# Patient Record
Sex: Male | Born: 1940 | Race: White | Hispanic: No | Marital: Married | Smoking: Never smoker
Health system: Southern US, Community
[De-identification: ages and names within clinical notes are randomized; demographics above are authoritative.]

## PROBLEM LIST (undated history)

## (undated) DIAGNOSIS — M1990 Unspecified osteoarthritis, unspecified site: Secondary | ICD-10-CM

## (undated) DIAGNOSIS — J209 Acute bronchitis, unspecified: Secondary | ICD-10-CM

## (undated) DIAGNOSIS — N281 Cyst of kidney, acquired: Secondary | ICD-10-CM

## (undated) DIAGNOSIS — E669 Obesity, unspecified: Secondary | ICD-10-CM

## (undated) DIAGNOSIS — I10 Essential (primary) hypertension: Secondary | ICD-10-CM

## (undated) DIAGNOSIS — K7689 Other specified diseases of liver: Secondary | ICD-10-CM

## (undated) HISTORY — PX: WISDOM TOOTH EXTRACTION: SHX21

## (undated) HISTORY — PX: CYST EXCISION: SHX5701

## (undated) HISTORY — DX: Obesity, unspecified: E66.9

---

## 2012-09-05 DIAGNOSIS — Q446 Cystic disease of liver: Secondary | ICD-10-CM | POA: Insufficient documentation

## 2014-01-10 ENCOUNTER — Encounter: Payer: Self-pay | Admitting: Sports Medicine

## 2014-01-10 ENCOUNTER — Ambulatory Visit (INDEPENDENT_AMBULATORY_CARE_PROVIDER_SITE_OTHER): Payer: Medicare Other | Admitting: Sports Medicine

## 2014-01-10 VITALS — BP 167/90 | HR 83 | Ht 69.0 in | Wt 198.0 lb

## 2014-01-10 DIAGNOSIS — Z96652 Presence of left artificial knee joint: Secondary | ICD-10-CM | POA: Insufficient documentation

## 2014-01-10 DIAGNOSIS — M1710 Unilateral primary osteoarthritis, unspecified knee: Secondary | ICD-10-CM

## 2014-01-10 DIAGNOSIS — M1712 Unilateral primary osteoarthritis, left knee: Secondary | ICD-10-CM

## 2014-01-10 MED ORDER — DEVILS CLAW ROOT POWD: Status: DC

## 2014-01-10 MED ORDER — GLUCOSAMINE-CHONDROITIN 750-600 MG PO CHEW: 2.0000 | CHEWABLE_TABLET | Freq: Two times a day (BID) | ORAL | Status: DC

## 2014-01-10 MED ORDER — DICLOFENAC SODIUM 2 % TD SOLN: 2.0000 | Freq: Two times a day (BID) | TRANSDERMAL | Status: DC

## 2014-01-10 MED ORDER — CELECOXIB 200 MG PO CAPS: ORAL_CAPSULE | ORAL | Status: DC

## 2014-01-10 NOTE — Progress Notes (Signed)
Patient ID: Ralph Kim, male   DOB: July 13, 1940, 73 y.o.   MRN: [id number]   Subjective:    I'm seeing this patient as a consultation for: Dr. Archie Balboa  CC: Left knee pain  HPI: Ralph Kim is a very pleasant 73 year old male with self-reported history of osteoarthritis of the left knee who presents with 2 years of constant left knee pain. This initially began when he stepped from a ladder and twisted his knee. Pain is worse with use and improves with rest. Per patient, there is bone-on-bone osteoarthritis of the medial compartment. He has previously received cortisone shots, but could not continue due to allergic reaction. Also had some relief with oral Voltaren, but discontinued after diagnosis of bleeding liver cyst. Subsequently switched to topical Voltaren which provided very little relief and he has since stopped this medication. Completed one course of Supartz with last injection about 2 months ago, but pain has since returned. TENS unit unsuccessful. Has what sounds to be an osteoarthritis unloader brace, but reports infrequent use.  Past medical history, Surgical history, Family history not pertinant except as noted below, Social history, Allergies, and medications have been entered into the medical record, reviewed, and no changes needed.   Review of Systems: No headache, visual changes, nausea, vomiting, diarrhea, constipation, dizziness, abdominal pain, skin rash, fevers, chills, night sweats, weight loss, swollen lymph nodes, body aches, joint swelling, muscle aches, chest pain, shortness of breath, mood changes, visual or auditory hallucinations.   Objective:   General: Well Developed, well nourished, and in no acute distress.  Neuro/Psych: Alert and oriented x3, extra-ocular muscles intact, able to move all 4 extremities, sensation grossly intact. Skin: Warm and dry, no rashes noted.  Respiratory: Not using accessory muscles, speaking in full sentences, trachea midline.  Cardiovascular:  Pulses palpable, no extremity edema. Abdomen: Does not appear distended.  Left Knee: Normal to inspection with no erythema, effusion or obvious bony abnormalities. Palpation normal with no warmth, patellar tenderness, or condyle tenderness. Positive medial joint line tenderness. ROM full in flexion and extension and lower leg rotation. Ligaments with solid consistent endpoints including ACL, PCL, LCL, MCL. Positive Mcmurray's. Negative Apley's and Thessalonian tests. Non painful patellar compression. Patellar glide with painless crepitus. Patellar and quadriceps tendons unremarkable. Hamstring and quadriceps strength is normal.   Impression and Recommendations:   This case required medical decision making of moderate complexity.  Osteoarthritis of left knee: This patient has a reported history of bone-on-bone osteoarthritis in the medial compartment that has failed steroid injection and Visco supplementation. We did discuss exhausting conservative measures, but that knee replacement is a likely endpoint. - Pennsaid - Celebrex - Supplementation with glucosamine and chondroitin, devil's claw - Increase use of OA unloader brace - Referral to formal PT - Return for custom orthotics

## 2014-01-10 NOTE — Assessment & Plan Note (Signed)
Per patient has bone-on-bone osteoarthritis in the medial compartment. Has already failed steroid injection and Visco supplementation. He does have what sounds to be an OA unloader brace, and he will wear this more often. Switching to Celebrex, adding topical NSAID. Also adding glucosamine and chondroitin as well as devils claw. Formal physical therapy. Return to see me for custom orthotics. Unfortunately I do think he has a knee replacement in the future.

## 2014-01-16 ENCOUNTER — Ambulatory Visit (INDEPENDENT_AMBULATORY_CARE_PROVIDER_SITE_OTHER): Payer: Medicare Other | Admitting: Physical Therapy

## 2014-01-16 ENCOUNTER — Encounter: Payer: Medicare Other | Admitting: Sports Medicine

## 2014-01-16 DIAGNOSIS — M6281 Muscle weakness (generalized): Secondary | ICD-10-CM

## 2014-01-16 DIAGNOSIS — R262 Difficulty in walking, not elsewhere classified: Secondary | ICD-10-CM

## 2014-01-16 DIAGNOSIS — M25569 Pain in unspecified knee: Secondary | ICD-10-CM

## 2014-01-16 DIAGNOSIS — M1710 Unilateral primary osteoarthritis, unspecified knee: Secondary | ICD-10-CM

## 2014-01-17 ENCOUNTER — Ambulatory Visit: Payer: Medicare Other | Admitting: Physical Therapy

## 2014-01-24 ENCOUNTER — Encounter: Payer: Self-pay | Admitting: Sports Medicine

## 2014-01-24 ENCOUNTER — Ambulatory Visit (INDEPENDENT_AMBULATORY_CARE_PROVIDER_SITE_OTHER): Payer: Medicare Other | Admitting: Sports Medicine

## 2014-01-24 VITALS — BP 155/79 | HR 75 | Ht 69.0 in | Wt 197.0 lb

## 2014-01-24 DIAGNOSIS — M1710 Unilateral primary osteoarthritis, unspecified knee: Secondary | ICD-10-CM

## 2014-01-24 DIAGNOSIS — M1712 Unilateral primary osteoarthritis, left knee: Secondary | ICD-10-CM

## 2014-01-24 NOTE — Progress Notes (Signed)

## 2014-01-24 NOTE — Assessment & Plan Note (Signed)
Custom orthotics as above. Continues to use Glucosamine and Chondroitin, topical diclofenac, physical therapy. Has discontinued Celebrex, had a rash, does not desire to proceed with any further oral NSAIDs. Continues to be resistant to total knee arthroplasty.

## 2014-01-31 ENCOUNTER — Encounter: Payer: Medicare Other | Admitting: Physical Therapy

## 2014-02-07 ENCOUNTER — Encounter (INDEPENDENT_AMBULATORY_CARE_PROVIDER_SITE_OTHER): Payer: Medicare Other

## 2014-02-07 DIAGNOSIS — R262 Difficulty in walking, not elsewhere classified: Secondary | ICD-10-CM

## 2014-02-07 DIAGNOSIS — M6281 Muscle weakness (generalized): Secondary | ICD-10-CM

## 2014-02-07 DIAGNOSIS — M25569 Pain in unspecified knee: Secondary | ICD-10-CM

## 2014-02-07 DIAGNOSIS — M1710 Unilateral primary osteoarthritis, unspecified knee: Secondary | ICD-10-CM

## 2014-10-30 ENCOUNTER — Other Ambulatory Visit: Payer: Self-pay | Admitting: Sports Medicine

## 2014-10-30 DIAGNOSIS — M1712 Unilateral primary osteoarthritis, left knee: Secondary | ICD-10-CM

## 2014-10-30 MED ORDER — DICLOFENAC SODIUM 2 % TD SOLN: 2.0000 | Freq: Two times a day (BID) | TRANSDERMAL | Status: DC

## 2015-01-30 ENCOUNTER — Telehealth: Payer: Self-pay | Admitting: Sports Medicine

## 2015-01-30 NOTE — Telephone Encounter (Signed)
No problem, I'll do it. 15 min pre-op clearance visit, not a physical

## 2015-01-30 NOTE — Telephone Encounter (Signed)
Patient's wife called and req to have Dr. Karie Schwalbe do a medical clearance for knee replacement surgery. Patient stated his pcp is Dr. Archie Balboa but they are unhappy their and feel like a number. Thanks

## 2015-02-03 ENCOUNTER — Ambulatory Visit (INDEPENDENT_AMBULATORY_CARE_PROVIDER_SITE_OTHER): Payer: Medicare Other | Admitting: Sports Medicine

## 2015-02-03 ENCOUNTER — Encounter: Payer: Self-pay | Admitting: Sports Medicine

## 2015-02-03 VITALS — BP 169/81 | HR 71 | Wt 197.0 lb

## 2015-02-03 DIAGNOSIS — M1712 Unilateral primary osteoarthritis, left knee: Secondary | ICD-10-CM | POA: Diagnosis not present

## 2015-02-03 DIAGNOSIS — Z01818 Encounter for other preprocedural examination: Secondary | ICD-10-CM | POA: Diagnosis not present

## 2015-02-03 LAB — COMPREHENSIVE METABOLIC PANEL
AST: 16 U/L (ref 10–35)
BUN: 14 mg/dL (ref 7–25)
CO2: 25 mmol/L (ref 20–31)
Creat: 0.83 mg/dL (ref 0.70–1.18)
Glucose, Bld: 94 mg/dL (ref 65–99)
Sodium: 140 mmol/L (ref 135–146)
Total Bilirubin: 0.5 mg/dL (ref 0.2–1.2)
Total Protein: 6.5 g/dL (ref 6.1–8.1)

## 2015-02-03 LAB — CBC
HCT: 42.8 % (ref 39.0–52.0)
Hemoglobin: 14.9 g/dL (ref 13.0–17.0)
MCH: 31.6 pg (ref 26.0–34.0)
MCHC: 34.8 g/dL (ref 30.0–36.0)
MCV: 90.9 fL (ref 78.0–100.0)
MPV: 10.6 fL (ref 8.6–12.4)
Platelets: 210 10*3/uL (ref 150–400)
RBC: 4.71 MIL/uL (ref 4.22–5.81)
RDW: 13.9 % (ref 11.5–15.5)
WBC: 4.7 10*3/uL (ref 4.0–10.5)

## 2015-02-03 LAB — COMPREHENSIVE METABOLIC PANEL WITH GFR
ALT: 14 U/L (ref 9–46)
Albumin: 4.3 g/dL (ref 3.6–5.1)
Alkaline Phosphatase: 67 U/L (ref 40–115)
Calcium: 9.1 mg/dL (ref 8.6–10.3)
Chloride: 105 mmol/L (ref 98–110)
Potassium: 4.2 mmol/L (ref 3.5–5.3)

## 2015-02-03 MED ORDER — CARVEDILOL 6.25 MG PO TABS: 6.2500 mg | ORAL_TABLET | Freq: Two times a day (BID) | ORAL | Status: DC

## 2015-02-03 NOTE — Progress Notes (Signed)
  Subjective:    CC: Preoperative clearance  HPI:  This is a pleasant 74 year old male, for sometime now we have been treating him for his in stage left knee osteoarthritis, he has finally agreed to proceed with total knee arthroplasty and needs surgical clearance. He does have greater than 4 metabolic equivalents of exercise tolerance without chest pain or shortness of breath.  Past medical history, Surgical history, Family history not pertinant except as noted below, Social history, Allergies, and medications have been entered into the medical record, reviewed, and no changes needed.   Review of Systems: No fevers, chills, night sweats, weight loss, chest pain, or shortness of breath.   Objective:    General: Well Developed, well nourished, and in no acute distress.  Neuro: Alert and oriented x3, extra-ocular muscles intact, sensation grossly intact.  HEENT: Normocephalic, atraumatic, pupils equal round reactive to light, neck supple, no masses, no lymphadenopathy, thyroid nonpalpable.  Skin: Warm and dry, no rashes. Cardiac: Regular rate and rhythm, no murmurs rubs or gallops, no lower extremity edema.  Respiratory: Clear to auscultation bilaterally. Not using accessory muscles, speaking in full sentences.  Twelve-lead EKG personally reviewed and shows normal axis, normal rate, normal rhythm. No Q waves, no ST changes.  Impression and Recommendations:   I spent 40 minutes with this patient, greater than 50% was face-to-face time counseling regarding the above diagnoses

## 2015-02-03 NOTE — Assessment & Plan Note (Signed)
Patient has greater than 4 metabolic equivalents of exercise tolerance, ECG is normal, checking CBC, renal function. Blood pressure is somewhat elevated so I am going to add a low-dose carvedilol, we will recheck his blood pressure in 2 weeks. Perioperative beta blockers do reduce cardiovascular mortality.

## 2015-02-03 NOTE — Assessment & Plan Note (Signed)
Patient is planning to have a knee arthroplasty, needs preoperative medical clearance.

## 2015-02-04 LAB — PROTIME-INR
INR: 1.1 (ref <1.50)
Prothrombin Time: 14.3 seconds (ref 11.6–15.2)

## 2015-02-04 LAB — APTT: aPTT: 30 seconds (ref 24–37)

## 2015-02-05 NOTE — Addendum Note (Signed)
Addended by: Collie Siad on: 02/05/2015 11:33 AM   Modules accepted: Orders

## 2015-02-17 ENCOUNTER — Encounter: Payer: Self-pay | Admitting: Sports Medicine

## 2015-02-17 ENCOUNTER — Ambulatory Visit (INDEPENDENT_AMBULATORY_CARE_PROVIDER_SITE_OTHER): Payer: Medicare Other | Admitting: Sports Medicine

## 2015-02-17 VITALS — BP 176/79 | HR 63 | Ht 69.0 in | Wt 200.0 lb

## 2015-02-17 DIAGNOSIS — Z01818 Encounter for other preprocedural examination: Secondary | ICD-10-CM

## 2015-02-17 MED ORDER — CARVEDILOL 12.5 MG PO TABS: 12.5000 mg | ORAL_TABLET | Freq: Two times a day (BID) | ORAL | Status: DC

## 2015-02-17 NOTE — Progress Notes (Signed)
  Subjective:    CC: follow-up  HPI: This pleasant 74 year old male with hypertension returns, we did not clear him at the last visit due to uncontrolled hypertension, I started carvedilol low-dose the last visit and he returns today with blood pressure significantly improved. His home records indicate essentially normal blood pressures however he is very elevated today suggesting a component of whitecoat hypertension.  Past medical history, Surgical history, Family history not pertinant except as noted below, Social history, Allergies, and medications have been entered into the medical record, reviewed, and no changes needed.   Review of Systems: No fevers, chills, night sweats, weight loss, chest pain, or shortness of breath.   Objective:    General: Well Developed, well nourished, and in no acute distress.  Neuro: Alert and oriented x3, extra-ocular muscles intact, sensation grossly intact.  HEENT: Normocephalic, atraumatic, pupils equal round reactive to light, neck supple, no masses, no lymphadenopathy, thyroid nonpalpable.  Skin: Warm and dry, no rashes. Cardiac: Regular rate and rhythm, no murmurs rubs or gallops, no lower extremity edema.  Respiratory: Clear to auscultation bilaterally. Not using accessory muscles, speaking in full sentences.  Impression and Recommendations:    I spent 25 minutes with this patient, greater than 50% was face-to-face time counseling regarding the above diagnoses

## 2015-02-17 NOTE — Assessment & Plan Note (Signed)
I started carvedilol low-dose the last visit and he returns today with blood pressure significantly improved. His home records indicate essentially normal blood pressures however he is very elevated today suggesting a component of whitecoat hypertension. I'm going to increase to 12.5 mg of carvedilol twice a day, he will return in one week for a blood pressure nurse visit

## 2015-02-24 ENCOUNTER — Ambulatory Visit (INDEPENDENT_AMBULATORY_CARE_PROVIDER_SITE_OTHER): Payer: Medicare Other | Admitting: Sports Medicine

## 2015-02-24 VITALS — BP 168/89 | HR 57

## 2015-02-24 DIAGNOSIS — I10 Essential (primary) hypertension: Secondary | ICD-10-CM | POA: Diagnosis not present

## 2015-02-24 DIAGNOSIS — Z01818 Encounter for other preprocedural examination: Secondary | ICD-10-CM | POA: Diagnosis not present

## 2015-02-24 NOTE — Progress Notes (Signed)
   Subjective:    Patient ID: Ralph Kim, male    DOB: [birth year], 74 y.o.   MRN: [id number]  HPI  Ralph Kim is here for a blood pressure check. He reports taking the carvedilol daily as directed. Home blood pressure readings.   02/20/2015   144/84 02/21/2015   148/77 02/22/2015   134/80                    148/81                    137/82 02/23/2015 130/76 02/24/2015 122/72  Review of Systems     Objective:   Physical Exam        Assessment & Plan:  Hypertension - Blood pressure elevated.

## 2015-02-24 NOTE — Progress Notes (Signed)
Left message for patient to call back and give information on whom needs clearance.

## 2015-02-24 NOTE — Assessment & Plan Note (Signed)
Blood pressures at home remain well-controlled, he is elevated every time he comes here, suggestive of whitecoat hypertension. He will continue his beta blocker perioperatively, and I think he is cleared for surgery at this time, intermediate risk noncardiac.  A letter will be written for clearance.

## 2015-02-25 ENCOUNTER — Telehealth: Payer: Self-pay | Admitting: Sports Medicine

## 2015-02-25 NOTE — Telephone Encounter (Signed)
Left voicemail for Pt advising there is a letter by Dr. Benjamin Stainhekkekandam ready for the Pt. If we need to send this letter somewhere we will need a fax number. Otherwise the letter will be placed upfront for pick up.

## 2015-03-04 NOTE — Progress Notes (Signed)
Sent to Dr Luiz BlareGraves with [facility].

## 2015-03-12 ENCOUNTER — Other Ambulatory Visit: Payer: Self-pay | Admitting: Orthopedic Surgery

## 2015-03-25 ENCOUNTER — Encounter (HOSPITAL_COMMUNITY)
Admission: RE | Admit: 2015-03-25 | Discharge: 2015-03-25 | Disposition: A | Discharge status: Home / Self Care | Payer: Medicare Other | Source: Ambulatory Visit | Attending: Orthopedic Surgery | Admitting: Orthopedic Surgery

## 2015-03-25 ENCOUNTER — Ambulatory Visit (HOSPITAL_COMMUNITY)
Admission: RE | Admit: 2015-03-25 | Discharge: 2015-03-25 | Disposition: A | Discharge status: Home / Self Care | Payer: Medicare Other | Source: Ambulatory Visit | Attending: Orthopedic Surgery | Admitting: Orthopedic Surgery

## 2015-03-25 ENCOUNTER — Encounter (HOSPITAL_COMMUNITY): Payer: Self-pay

## 2015-03-25 DIAGNOSIS — M1712 Unilateral primary osteoarthritis, left knee: Secondary | ICD-10-CM | POA: Diagnosis not present

## 2015-03-25 DIAGNOSIS — Z01818 Encounter for other preprocedural examination: Secondary | ICD-10-CM | POA: Diagnosis present

## 2015-03-25 DIAGNOSIS — Z01812 Encounter for preprocedural laboratory examination: Secondary | ICD-10-CM | POA: Diagnosis not present

## 2015-03-25 HISTORY — DX: Essential (primary) hypertension: I10

## 2015-03-25 HISTORY — DX: Other specified diseases of liver: K76.89

## 2015-03-25 HISTORY — DX: Acute bronchitis, unspecified: J20.9

## 2015-03-25 HISTORY — DX: Unspecified osteoarthritis, unspecified site: M19.90

## 2015-03-25 HISTORY — DX: Cyst of kidney, acquired: N28.1

## 2015-03-25 LAB — SURGICAL PCR SCREEN
MRSA, PCR: NEGATIVE
Staphylococcus aureus: NEGATIVE

## 2015-03-25 LAB — URINALYSIS, ROUTINE W REFLEX MICROSCOPIC
BILIRUBIN URINE: NEGATIVE
Glucose, UA: NEGATIVE mg/dL
HGB URINE DIPSTICK: NEGATIVE
Ketones, ur: NEGATIVE mg/dL
Leukocytes, UA: NEGATIVE
Nitrite: NEGATIVE
PH: 6.5 (ref 5.0–8.0)
Protein, ur: NEGATIVE mg/dL
SPECIFIC GRAVITY, URINE: 1.013 (ref 1.005–1.030)
UROBILINOGEN UA: 1 mg/dL (ref 0.0–1.0)

## 2015-03-25 LAB — CBC WITH DIFFERENTIAL/PLATELET
BASOS ABS: 0 10*3/uL (ref 0.0–0.1)
Basophils Relative: 0 %
EOS ABS: 0.2 10*3/uL (ref 0.0–0.7)
Eosinophils Relative: 3 %
HCT: 44.2 % (ref 39.0–52.0)
Hemoglobin: 14.9 g/dL (ref 13.0–17.0)
LYMPHS ABS: 1.1 10*3/uL (ref 0.7–4.0)
Lymphocytes Relative: 20 %
MCH: 31 pg (ref 26.0–34.0)
MCHC: 33.7 g/dL (ref 30.0–36.0)
MCV: 91.9 fL (ref 78.0–100.0)
MONOS PCT: 8 %
Monocytes Absolute: 0.4 10*3/uL (ref 0.1–1.0)
NEUTROS PCT: 69 %
Neutro Abs: 4 10*3/uL (ref 1.7–7.7)
PLATELETS: 220 10*3/uL (ref 150–400)
RBC: 4.81 MIL/uL (ref 4.22–5.81)
RDW: 12.8 % (ref 11.5–15.5)
WBC: 5.7 10*3/uL (ref 4.0–10.5)

## 2015-03-25 LAB — COMPREHENSIVE METABOLIC PANEL
ALT: 20 U/L (ref 17–63)
ANION GAP: 8 (ref 5–15)
AST: 23 U/L (ref 15–41)
Albumin: 3.9 g/dL (ref 3.5–5.0)
Alkaline Phosphatase: 66 U/L (ref 38–126)
BILIRUBIN TOTAL: 0.4 mg/dL (ref 0.3–1.2)
BUN: 11 mg/dL (ref 6–20)
CHLORIDE: 108 mmol/L (ref 101–111)
CO2: 22 mmol/L (ref 22–32)
Calcium: 9.5 mg/dL (ref 8.9–10.3)
Creatinine, Ser: 0.82 mg/dL (ref 0.61–1.24)
Glucose, Bld: 104 mg/dL — ABNORMAL HIGH (ref 65–99)
POTASSIUM: 4.6 mmol/L (ref 3.5–5.1)
Sodium: 138 mmol/L (ref 135–145)
TOTAL PROTEIN: 6.6 g/dL (ref 6.5–8.1)

## 2015-03-25 LAB — TYPE AND SCREEN
ABO/RH(D): A POS
ANTIBODY SCREEN: NEGATIVE

## 2015-03-25 LAB — APTT: aPTT: 27 seconds (ref 24–37)

## 2015-03-25 LAB — ABO/RH: ABO/RH(D): A POS

## 2015-03-25 LAB — PROTIME-INR
INR: 1.09 (ref 0.00–1.49)
PROTHROMBIN TIME: 14.3 s (ref 11.6–15.2)

## 2015-03-25 NOTE — Pre-Procedure Instructions (Signed)
Ralph Kim  03/25/2015      [facility] - [place], [place] - [address] [place] [address] Phone: [phone] Fax: [phone]    Your procedure is scheduled on Monday, March 30, 2015  Report to [facility] Admitting at 10:45 A.M.  Call this number if you have problems the morning of surgery:  [phone]   Remember:  Do not eat food or drink liquids after midnight Sunday, March 29, 2015  Take these medicines the morning of surgery with A SIP OF WATER :carvedilol (COREG)  Stop taking Aspirin, vitamins, fish oil and herbal medications such as Turmeric. Do not take any NSAIDs ie: Ibuprofen, Advil, Naproxen ( Aleve) or any medication containing Aspirin such as Diclofenac Sodium (PENNSAID) ; stop now  Do not wear jewelry, make-up or nail polish.  Do not wear lotions, powders, or perfumes.  You may not wear deodorant.  Do not shave 48 hours prior to surgery.  Men may shave face and neck.  Do not bring valuables to the hospital.  [facility] is not responsible for any belongings or valuables.  Contacts, dentures or bridgework may not be worn into surgery.  Leave your suitcase in the car.  After surgery it may be brought to your room.  For patients admitted to the hospital, discharge time will be determined by your treatment team.  Patients discharged the day of surgery will not be allowed to drive home.   Name and phone number of your driver:   Special instructions:  Special Instructions:Special Instructions: [facility] - Preparing for Surgery  Before surgery, you can play an important role.  Because skin is not sterile, your skin needs to be as free of germs as possible.  You can reduce the number of germs on you skin by washing with CHG (chlorahexidine gluconate) soap before surgery.  CHG is an antiseptic cleaner which kills germs and bonds with the skin to continue killing germs even after  washing.  Please DO NOT use if you have an allergy to CHG or antibacterial soaps.  If your skin becomes reddened/irritated stop using the CHG and inform your nurse when you arrive at Short Stay.  Do not shave (including legs and underarms) for at least 48 hours prior to the first CHG shower.  You may shave your face.  Please follow these instructions carefully:   1.  Shower with CHG Soap the night before surgery and the morning of Surgery.  2.  If you choose to wash your hair, wash your hair first as usual with your normal shampoo.  3.  After you shampoo, rinse your hair and body thoroughly to remove the Shampoo.  4.  Use CHG as you would any other liquid soap.  You can apply chg directly  to the skin and wash gently with scrungie or a clean washcloth.  5.  Apply the CHG Soap to your body ONLY FROM THE NECK DOWN.  Do not use on open wounds or open sores.  Avoid contact with your eyes, ears, mouth and genitals (private parts).  Wash genitals (private parts) with your normal soap.  6.  Wash thoroughly, paying special attention to the area where your surgery will be performed.  7.  Thoroughly rinse your body with warm water from the neck down.  8.  DO NOT shower/wash with your normal soap after using and rinsing off the CHG Soap.  9.  Pat yourself dry  with a clean towel.            10.  Wear clean pajamas.            11.  Place clean sheets on your bed the night of your first shower and do not sleep with pets.  Day of Surgery  Do not apply any lotions/deodorants the morning of surgery.  Please wear clean clothes to the hospital/surgery center.  Please read over the following fact sheets that you were given. Pain Booklet, Coughing and Deep Breathing, Blood Transfusion Information, Total Joint Packet, MRSA Information and Surgical Site Infection Prevention

## 2015-03-25 NOTE — Progress Notes (Signed)
Pt denies SOB, chest pain, and being under the care of a cardiologist. Pt denies having a stress test, echo and cardiac cath. Pt denies having a chest x ray within the last year.  

## 2015-03-30 ENCOUNTER — Encounter (HOSPITAL_COMMUNITY)
Admission: RE | Disposition: A | Discharge status: Home Health | Payer: Self-pay | Source: Ambulatory Visit | Attending: Orthopedic Surgery

## 2015-03-30 ENCOUNTER — Inpatient Hospital Stay (HOSPITAL_COMMUNITY): Payer: Medicare Other | Admitting: Anesthesiology

## 2015-03-30 ENCOUNTER — Encounter (HOSPITAL_COMMUNITY): Payer: Self-pay | Admitting: *Deleted

## 2015-03-30 ENCOUNTER — Inpatient Hospital Stay (HOSPITAL_COMMUNITY)
Admission: RE | Admit: 2015-03-30 | Discharge: 2015-04-01 | DRG: 470 | Disposition: A | Discharge status: Home Health | Payer: Medicare Other | Source: Ambulatory Visit | Attending: Orthopedic Surgery | Admitting: Orthopedic Surgery

## 2015-03-30 DIAGNOSIS — M25562 Pain in left knee: Secondary | ICD-10-CM | POA: Diagnosis present

## 2015-03-30 DIAGNOSIS — M1712 Unilateral primary osteoarthritis, left knee: Principal | ICD-10-CM | POA: Diagnosis present

## 2015-03-30 DIAGNOSIS — I10 Essential (primary) hypertension: Secondary | ICD-10-CM | POA: Diagnosis present

## 2015-03-30 DIAGNOSIS — Z79899 Other long term (current) drug therapy: Secondary | ICD-10-CM

## 2015-03-30 DIAGNOSIS — Z888 Allergy status to other drugs, medicaments and biological substances status: Secondary | ICD-10-CM | POA: Diagnosis not present

## 2015-03-30 HISTORY — PX: TOTAL KNEE ARTHROPLASTY: SHX125

## 2015-03-30 SURGERY — ARTHROPLASTY, KNEE, TOTAL
Anesthesia: General | Site: Knee | Laterality: Left

## 2015-03-30 MED ORDER — 0.9 % SODIUM CHLORIDE (POUR BTL) OPTIME
TOPICAL | Status: DC | PRN
  Administered 2015-03-30: 1000 mL

## 2015-03-30 MED ORDER — SODIUM CHLORIDE 0.9 % IR SOLN
Status: DC | PRN
  Administered 2015-03-30: 3000 mL

## 2015-03-30 MED ORDER — BACLOFEN 10 MG PO TABS: 10.0000 mg | ORAL_TABLET | Freq: Three times a day (TID) | ORAL | Status: DC | PRN

## 2015-03-30 MED ORDER — ACETAMINOPHEN 325 MG PO TABS: 325.0000 mg | ORAL_TABLET | ORAL | Status: DC | PRN

## 2015-03-30 MED ORDER — HYDROMORPHONE HCL 1 MG/ML IJ SOLN
INTRAMUSCULAR | Status: AC
  Filled 2015-03-30: qty 2

## 2015-03-30 MED ORDER — PROPOFOL 500 MG/50ML IV EMUL
INTRAVENOUS | Status: DC | PRN
  Administered 2015-03-30: 25 ug/kg/min via INTRAVENOUS

## 2015-03-30 MED ORDER — LACTATED RINGERS IV SOLN
INTRAVENOUS | Status: DC
  Administered 2015-03-30 (×3): via INTRAVENOUS

## 2015-03-30 MED ORDER — OXYCODONE HCL 5 MG PO TABS
5.0000 mg | ORAL_TABLET | Freq: Once | ORAL | Status: AC | PRN
  Administered 2015-03-30: 5 mg via ORAL

## 2015-03-30 MED ORDER — SODIUM CHLORIDE 0.9 % IV SOLN
1000.0000 mg | INTRAVENOUS | Status: AC
  Administered 2015-03-30: 1000 mg via INTRAVENOUS
  Filled 2015-03-30: qty 10

## 2015-03-30 MED ORDER — OXYCODONE HCL 5 MG PO TABS
ORAL_TABLET | ORAL | Status: AC
  Filled 2015-03-30: qty 2

## 2015-03-30 MED ORDER — CARVEDILOL 12.5 MG PO TABS
12.5000 mg | ORAL_TABLET | Freq: Two times a day (BID) | ORAL | Status: DC
  Administered 2015-03-31 – 2015-04-01 (×3): 12.5 mg via ORAL
  Filled 2015-03-30 (×3): qty 1

## 2015-03-30 MED ORDER — MIDAZOLAM HCL 2 MG/2ML IJ SOLN
INTRAMUSCULAR | Status: AC
  Filled 2015-03-30: qty 4

## 2015-03-30 MED ORDER — CEFAZOLIN SODIUM-DEXTROSE 2-3 GM-% IV SOLR
2.0000 g | INTRAVENOUS | Status: AC
  Administered 2015-03-30: 2 g via INTRAVENOUS
  Filled 2015-03-30: qty 50

## 2015-03-30 MED ORDER — OXYCODONE-ACETAMINOPHEN 5-325 MG PO TABS: 1.0000 | ORAL_TABLET | ORAL | Status: DC | PRN

## 2015-03-30 MED ORDER — CEFAZOLIN SODIUM-DEXTROSE 2-3 GM-% IV SOLR
2.0000 g | Freq: Four times a day (QID) | INTRAVENOUS | Status: AC
  Administered 2015-03-30 – 2015-03-31 (×2): 2 g via INTRAVENOUS
  Filled 2015-03-30 (×2): qty 50

## 2015-03-30 MED ORDER — HYDROMORPHONE HCL 1 MG/ML IJ SOLN
0.2500 mg | INTRAMUSCULAR | Status: DC | PRN
  Administered 2015-03-30 (×4): 0.5 mg via INTRAVENOUS

## 2015-03-30 MED ORDER — MIDAZOLAM HCL 5 MG/5ML IJ SOLN
INTRAMUSCULAR | Status: DC | PRN
  Administered 2015-03-30 (×2): 1 mg via INTRAVENOUS

## 2015-03-30 MED ORDER — BUPIVACAINE IN DEXTROSE 0.75-8.25 % IT SOLN
INTRATHECAL | Status: DC | PRN
  Administered 2015-03-30: 1.8 mL via INTRATHECAL

## 2015-03-30 MED ORDER — BISACODYL 5 MG PO TBEC: 5.0000 mg | DELAYED_RELEASE_TABLET | Freq: Every day | ORAL | Status: DC | PRN

## 2015-03-30 MED ORDER — MAGNESIUM CITRATE PO SOLN: 1.0000 | Freq: Once | ORAL | Status: DC | PRN

## 2015-03-30 MED ORDER — ACETAMINOPHEN 10 MG/ML IV SOLN
INTRAVENOUS | Status: AC
  Filled 2015-03-30: qty 100

## 2015-03-30 MED ORDER — OXYCODONE HCL 5 MG/5ML PO SOLN: 5.0000 mg | Freq: Once | ORAL | Status: AC | PRN

## 2015-03-30 MED ORDER — ASPIRIN EC 325 MG PO TBEC: 325.0000 mg | DELAYED_RELEASE_TABLET | Freq: Two times a day (BID) | ORAL | Status: DC

## 2015-03-30 MED ORDER — ACETAMINOPHEN 10 MG/ML IV SOLN
1000.0000 mg | Freq: Once | INTRAVENOUS | Status: AC
  Administered 2015-03-30: 1000 mg via INTRAVENOUS

## 2015-03-30 MED ORDER — BUPIVACAINE HCL (PF) 0.5 % IJ SOLN
INTRAMUSCULAR | Status: DC | PRN
  Administered 2015-03-30: 20 mL

## 2015-03-30 MED ORDER — ACETAMINOPHEN 650 MG RE SUPP: 650.0000 mg | Freq: Four times a day (QID) | RECTAL | Status: DC | PRN

## 2015-03-30 MED ORDER — BUPIVACAINE LIPOSOME 1.3 % IJ SUSP
20.0000 mL | Freq: Once | INTRAMUSCULAR | Status: DC
  Filled 2015-03-30: qty 20

## 2015-03-30 MED ORDER — DOCUSATE SODIUM 100 MG PO CAPS
100.0000 mg | ORAL_CAPSULE | Freq: Two times a day (BID) | ORAL | Status: DC
  Administered 2015-03-30 – 2015-04-01 (×4): 100 mg via ORAL
  Filled 2015-03-30 (×4): qty 1

## 2015-03-30 MED ORDER — DIPHENHYDRAMINE HCL 12.5 MG/5ML PO ELIX: 12.5000 mg | ORAL_SOLUTION | ORAL | Status: DC | PRN

## 2015-03-30 MED ORDER — HYDROMORPHONE HCL 1 MG/ML IJ SOLN: 0.2500 mg | INTRAMUSCULAR | Status: DC | PRN

## 2015-03-30 MED ORDER — DEXTROSE 5 % IV SOLN
500.0000 mg | Freq: Four times a day (QID) | INTRAVENOUS | Status: DC | PRN
  Administered 2015-03-30: 500 mg via INTRAVENOUS
  Filled 2015-03-30 (×3): qty 5

## 2015-03-30 MED ORDER — FENTANYL CITRATE (PF) 100 MCG/2ML IJ SOLN
INTRAMUSCULAR | Status: DC | PRN
  Administered 2015-03-30 (×3): 25 ug via INTRAVENOUS
  Administered 2015-03-30: 50 ug via INTRAVENOUS
  Administered 2015-03-30: 25 ug via INTRAVENOUS
  Administered 2015-03-30 (×2): 50 ug via INTRAVENOUS

## 2015-03-30 MED ORDER — ONDANSETRON HCL 4 MG/2ML IJ SOLN
4.0000 mg | Freq: Four times a day (QID) | INTRAMUSCULAR | Status: DC | PRN
  Administered 2015-03-30: 4 mg via INTRAVENOUS
  Filled 2015-03-30: qty 2

## 2015-03-30 MED ORDER — FENTANYL CITRATE (PF) 250 MCG/5ML IJ SOLN
INTRAMUSCULAR | Status: AC
  Filled 2015-03-30: qty 5

## 2015-03-30 MED ORDER — BUPIVACAINE-EPINEPHRINE (PF) 0.5% -1:200000 IJ SOLN
INTRAMUSCULAR | Status: AC
  Filled 2015-03-30: qty 30

## 2015-03-30 MED ORDER — ACETAMINOPHEN 160 MG/5ML PO SOLN
325.0000 mg | ORAL | Status: DC | PRN
  Filled 2015-03-30: qty 20.3

## 2015-03-30 MED ORDER — HYDROMORPHONE HCL 1 MG/ML IJ SOLN
INTRAMUSCULAR | Status: AC
  Filled 2015-03-30: qty 1

## 2015-03-30 MED ORDER — ASPIRIN EC 325 MG PO TBEC
325.0000 mg | DELAYED_RELEASE_TABLET | Freq: Two times a day (BID) | ORAL | Status: DC
  Administered 2015-03-31 – 2015-04-01 (×3): 325 mg via ORAL
  Filled 2015-03-30 (×3): qty 1

## 2015-03-30 MED ORDER — POLYETHYLENE GLYCOL 3350 17 G PO PACK
17.0000 g | PACK | Freq: Every day | ORAL | Status: DC | PRN
Start: 2015-03-30 — End: 2015-04-01

## 2015-03-30 MED ORDER — METHOCARBAMOL 500 MG PO TABS
500.0000 mg | ORAL_TABLET | Freq: Four times a day (QID) | ORAL | Status: DC | PRN
  Administered 2015-03-30 – 2015-04-01 (×4): 500 mg via ORAL
  Filled 2015-03-30 (×5): qty 1

## 2015-03-30 MED ORDER — EPHEDRINE SULFATE 50 MG/ML IJ SOLN
INTRAMUSCULAR | Status: DC | PRN
  Administered 2015-03-30 (×3): 5 mg via INTRAVENOUS

## 2015-03-30 MED ORDER — CHLORHEXIDINE GLUCONATE 4 % EX LIQD: 60.0000 mL | Freq: Once | CUTANEOUS | Status: DC

## 2015-03-30 MED ORDER — OXYCODONE-ACETAMINOPHEN 5-325 MG PO TABS
1.0000 | ORAL_TABLET | ORAL | Status: DC | PRN
  Administered 2015-03-30 – 2015-03-31 (×3): 2 via ORAL
  Administered 2015-03-31: 1 via ORAL
  Administered 2015-03-31 – 2015-04-01 (×2): 2 via ORAL
  Filled 2015-03-30 (×6): qty 2

## 2015-03-30 MED ORDER — ACETAMINOPHEN 325 MG PO TABS: 650.0000 mg | ORAL_TABLET | Freq: Four times a day (QID) | ORAL | Status: DC | PRN

## 2015-03-30 MED ORDER — SODIUM CHLORIDE 0.9 % IV SOLN
INTRAVENOUS | Status: DC
  Administered 2015-03-30: 18:00:00 via INTRAVENOUS

## 2015-03-30 MED ORDER — HYDROMORPHONE HCL 1 MG/ML IJ SOLN
0.5000 mg | INTRAMUSCULAR | Status: AC | PRN
  Administered 2015-03-30 (×2): 0.5 mg via INTRAVENOUS

## 2015-03-30 MED ORDER — HYDROMORPHONE HCL 1 MG/ML IJ SOLN
1.0000 mg | INTRAMUSCULAR | Status: DC | PRN
  Administered 2015-03-30: 1 mg via INTRAVENOUS
  Administered 2015-03-31: 2 mg via INTRAVENOUS
  Filled 2015-03-30: qty 1
  Filled 2015-03-30: qty 2

## 2015-03-30 MED ORDER — ONDANSETRON HCL 4 MG PO TABS: 4.0000 mg | ORAL_TABLET | Freq: Four times a day (QID) | ORAL | Status: DC | PRN

## 2015-03-30 MED ORDER — HYDROMORPHONE HCL 1 MG/ML IJ SOLN
0.5000 mg | INTRAMUSCULAR | Status: DC | PRN
  Administered 2015-03-30 (×2): 0.5 mg via INTRAVENOUS

## 2015-03-30 MED ORDER — PROMETHAZINE HCL 25 MG/ML IJ SOLN
6.2500 mg | Freq: Four times a day (QID) | INTRAMUSCULAR | Status: DC | PRN
Start: 2015-03-30 — End: 2015-04-01

## 2015-03-30 MED ORDER — BUPIVACAINE LIPOSOME 1.3 % IJ SUSP
INTRAMUSCULAR | Status: DC | PRN
  Administered 2015-03-30: 20 mL

## 2015-03-30 MED ORDER — PHENYLEPHRINE HCL 10 MG/ML IJ SOLN
INTRAMUSCULAR | Status: DC | PRN
  Administered 2015-03-30 (×2): 40 ug via INTRAVENOUS

## 2015-03-30 MED ORDER — ALUM & MAG HYDROXIDE-SIMETH 200-200-20 MG/5ML PO SUSP
30.0000 mL | ORAL | Status: DC | PRN
Start: 2015-03-30 — End: 2015-04-01

## 2015-03-30 MED ORDER — ZOLPIDEM TARTRATE 5 MG PO TABS: 5.0000 mg | ORAL_TABLET | Freq: Every evening | ORAL | Status: DC | PRN

## 2015-03-30 SURGICAL SUPPLY — 64 items
BANDAGE ESMARK 6X9 LF (GAUZE/BANDAGES/DRESSINGS) ×1 IMPLANT
BENZOIN TINCTURE PRP APPL 2/3 (GAUZE/BANDAGES/DRESSINGS) ×2 IMPLANT
BLADE SAGITTAL 25.0X1.19X90 (BLADE) ×2 IMPLANT
BLADE SAW SAG 90X13X1.27 (BLADE) ×2 IMPLANT
BNDG ESMARK 6X9 LF (GAUZE/BANDAGES/DRESSINGS) ×2
BOWL SMART MIX CTS (DISPOSABLE) ×2 IMPLANT
CAP KNEE TOTAL 3 SIGMA ×2 IMPLANT
CEMENT HV SMART SET (Cement) ×4 IMPLANT
CLSR STERI-STRIP ANTIMIC 1/2X4 (GAUZE/BANDAGES/DRESSINGS) ×2 IMPLANT
COVER SURGICAL LIGHT HANDLE (MISCELLANEOUS) ×2 IMPLANT
CUFF TOURNIQUET SINGLE 34IN LL (TOURNIQUET CUFF) ×2 IMPLANT
CUFF TOURNIQUET SINGLE 44IN (TOURNIQUET CUFF) IMPLANT
DRAPE EXTREMITY T 121X128X90 (DRAPE) ×2 IMPLANT
DRAPE IMP U-DRAPE 54X76 (DRAPES) ×2 IMPLANT
DRAPE U-SHAPE 47X51 STRL (DRAPES) ×2 IMPLANT
DRSG AQUACEL AG ADV 3.5X10 (GAUZE/BANDAGES/DRESSINGS) ×2 IMPLANT
DRSG MEPILEX BORDER 4X12 (GAUZE/BANDAGES/DRESSINGS) ×2 IMPLANT
DRSG PAD ABDOMINAL 8X10 ST (GAUZE/BANDAGES/DRESSINGS) ×2 IMPLANT
DURAPREP 26ML APPLICATOR (WOUND CARE) ×2 IMPLANT
ELECT REM PT RETURN 9FT ADLT (ELECTROSURGICAL) ×2
ELECTRODE REM PT RTRN 9FT ADLT (ELECTROSURGICAL) ×1 IMPLANT
EVACUATOR 1/8 PVC DRAIN (DRAIN) ×4 IMPLANT
FACESHIELD WRAPAROUND (MASK) ×2 IMPLANT
GAUZE SPONGE 4X4 12PLY STRL (GAUZE/BANDAGES/DRESSINGS) ×2 IMPLANT
GLOVE BIO SURGEON STRL SZ7 (GLOVE) ×2 IMPLANT
GLOVE BIOGEL PI IND STRL 7.0 (GLOVE) ×1 IMPLANT
GLOVE BIOGEL PI IND STRL 8 (GLOVE) ×2 IMPLANT
GLOVE BIOGEL PI INDICATOR 7.0 (GLOVE) ×1
GLOVE BIOGEL PI INDICATOR 8 (GLOVE) ×2
GLOVE ECLIPSE 7.5 STRL STRAW (GLOVE) ×4 IMPLANT
GOWN STRL REUS W/ TWL LRG LVL3 (GOWN DISPOSABLE) ×1 IMPLANT
GOWN STRL REUS W/ TWL XL LVL3 (GOWN DISPOSABLE) ×2 IMPLANT
GOWN STRL REUS W/TWL LRG LVL3 (GOWN DISPOSABLE) ×1
GOWN STRL REUS W/TWL XL LVL3 (GOWN DISPOSABLE) ×2
HANDPIECE INTERPULSE COAX TIP (DISPOSABLE) ×1
HOOD PEEL AWAY FACE SHEILD DIS (HOOD) ×6 IMPLANT
IMMOBILIZER KNEE 20 (SOFTGOODS) ×4 IMPLANT
IMMOBILIZER KNEE 20 THIGH 36 (SOFTGOODS) ×2 IMPLANT
IMMOBILIZER KNEE 22 UNIV (SOFTGOODS) ×2 IMPLANT
KIT BASIN OR (CUSTOM PROCEDURE TRAY) ×2 IMPLANT
KIT ROOM TURNOVER OR (KITS) ×2 IMPLANT
MANIFOLD NEPTUNE II (INSTRUMENTS) ×2 IMPLANT
NEEDLE SPNL 22GX3.5 QUINCKE BK (NEEDLE) ×2 IMPLANT
NS IRRIG 1000ML POUR BTL (IV SOLUTION) ×2 IMPLANT
PACK TOTAL JOINT (CUSTOM PROCEDURE TRAY) ×2 IMPLANT
PACK UNIVERSAL I (CUSTOM PROCEDURE TRAY) ×2 IMPLANT
PAD ARMBOARD 7.5X6 YLW CONV (MISCELLANEOUS) ×4 IMPLANT
PAD CAST 4YDX4 CTTN HI CHSV (CAST SUPPLIES) ×1 IMPLANT
PADDING CAST COTTON 4X4 STRL (CAST SUPPLIES) ×1
SET HNDPC FAN SPRY TIP SCT (DISPOSABLE) ×1 IMPLANT
SPONGE GAUZE 4X4 12PLY STER LF (GAUZE/BANDAGES/DRESSINGS) ×2 IMPLANT
STAPLER VISISTAT 35W (STAPLE) IMPLANT
STRIP CLOSURE SKIN 1/2X4 (GAUZE/BANDAGES/DRESSINGS) ×4 IMPLANT
SUCTION FRAZIER TIP 10 FR DISP (SUCTIONS) ×2 IMPLANT
SUT MNCRL AB 3-0 PS2 18 (SUTURE) IMPLANT
SUT VIC AB 0 CTB1 27 (SUTURE) ×4 IMPLANT
SUT VIC AB 1 CT1 27 (SUTURE) ×2
SUT VIC AB 1 CT1 27XBRD ANBCTR (SUTURE) ×2 IMPLANT
SUT VIC AB 2-0 CTB1 (SUTURE) ×4 IMPLANT
SYR 50ML LL SCALE MARK (SYRINGE) ×2 IMPLANT
TOWEL OR 17X24 6PK STRL BLUE (TOWEL DISPOSABLE) ×2 IMPLANT
TOWEL OR 17X26 10 PK STRL BLUE (TOWEL DISPOSABLE) ×2 IMPLANT
TRAY FOLEY CATH 16FRSI W/METER (SET/KITS/TRAYS/PACK) IMPLANT
WRAP KNEE MAXI GEL POST OP (GAUZE/BANDAGES/DRESSINGS) ×2 IMPLANT

## 2015-03-30 NOTE — Brief Op Note (Signed)
03/30/2015  3:11 PM  PATIENT:  Ralph Kim  74 y.o. male  PRE-OPERATIVE DIAGNOSIS:  osteoarthritis left knee  POST-OPERATIVE DIAGNOSIS:  osteoarthritis left knee  PROCEDURE:  Procedure(s): TOTAL KNEE ARTHROPLASTY (Left)  SURGEON:  Surgeon(s) and Role:    * Jodi GeraldsJohn Laquinton Bihm, MD - Primary  PHYSICIAN ASSISTANT:   ASSISTANTS: Orma Flaming*Bethune  ANESTHESIA:   general  EBL:  Total I/O In: 1800 [I.V.:1800] Out: 25 [Urine:25]  BLOOD ADMINISTERED:none  DRAINS: (1med) Hemovact drain(s) in the l knee with  Suction Open   LOCAL MEDICATIONS USED:  OTHER experel   SPECIMEN:  No Specimen  DISPOSITION OF SPECIMEN:  N/A  COUNTS:  YES  TOURNIQUET:   Total Tourniquet Time Documented: Thigh (Left) - 61 minutes Total: Thigh (Left) - 61 minutes   DICTATION: .Other Dictation: Dictation Number [id number]  PLAN OF CARE: Admit to inpatient   PATIENT DISPOSITION:  PACU - hemodynamically stable.   Delay start of Pharmacological VTE agent (>24hrs) due to surgical blood loss or risk of bleeding: no

## 2015-03-30 NOTE — Progress Notes (Signed)
Orthopedic Tech Progress Note Patient Details:  Ralph Kim 1941/02/02 [id number]  CPM Left Knee CPM Left Knee: On Left Knee Flexion (Degrees): 60 Left Knee Extension (Degrees): 0 Additional Comments: foot roll   Saul FordyceJennifer C Tandra Rosado 03/30/2015, 4:18 PM

## 2015-03-30 NOTE — Transfer of Care (Signed)
Immediate Anesthesia Transfer of Care Note  Patient: Ralph Kim  Procedure(s) Performed: Procedure(s): TOTAL KNEE ARTHROPLASTY (Left)  Patient Location: PACU  Anesthesia Type:General and Spinal  Level of Consciousness: awake, alert , oriented and patient cooperative  Airway & Oxygen Therapy: Patient Spontanous Breathing and Patient connected to nasal cannula oxygen  Post-op Assessment: Report given to RN, Post -op Vital signs reviewed and stable and Patient moving all extremities  Post vital signs: Reviewed and stable  Last Vitals:  Filed Vitals:   03/30/15 1142  BP: 209/85  Pulse:   Temp:   Resp:     Complications: No apparent anesthesia complications

## 2015-03-30 NOTE — Discharge Instructions (Signed)

## 2015-03-30 NOTE — Anesthesia Procedure Notes (Addendum)
Procedure Name: LMA Insertion Date/Time: 03/30/2015 1:39 PM Performed by: Orlinda BlalockMCMILLEN, MICHAEL L Pre-anesthesia Checklist: Patient identified, Emergency Drugs available, Suction available and Patient being monitored Patient Re-evaluated:Patient Re-evaluated prior to inductionOxygen Delivery Method: Circle system utilized Preoxygenation: Pre-oxygenation with 100% oxygen Intubation Type: IV induction LMA: LMA inserted LMA Size: 4.0 Number of attempts: 1 Placement Confirmation: positive ETCO2 and breath sounds checked- equal and bilateral Tube secured with: Tape Dental Injury: Teeth and Oropharynx as per pre-operative assessment    Spinal Patient location during procedure: OR Staffing Anesthesiologist: Cartha Rotert Preanesthetic Checklist Completed: patient identified, surgical consent, pre-op evaluation, timeout performed, IV checked, risks and benefits discussed and monitors and equipment checked Spinal Block Patient position: sitting Prep: site prepped and draped and DuraPrep Patient monitoring: heart rate, cardiac monitor, continuous pulse ox and blood pressure Approach: midline Location: L3-4 Injection technique: single-shot Needle Needle type: Pencan  Needle gauge: 24 G Needle length: 10 cm Assessment Sensory level: T12  1

## 2015-03-30 NOTE — Progress Notes (Signed)
Dr. Maple HudsonMoser notified of BP no further orders given

## 2015-03-30 NOTE — Op Note (Signed)
Ralph Kim, Ralph Kim                 ACCOUNT NO.:  [id number]  MEDICAL RECORD NO.:  [id number]  LOCATION:  [id number]                        FACILITY:  [facility]  PHYSICIAN:  Harvie Junior, M.D.   DATE OF BIRTH:  08-Sep-1940  DATE OF PROCEDURE:  03/30/2015 DATE OF DISCHARGE:                              OPERATIVE REPORT   PREOPERATIVE DIAGNOSIS:  End-stage degenerative joint disease, left knee.  POSTOPERATIVE DIAGNOSIS:  End-stage degenerative joint disease, left knee.  PROCEDURE:  Left total knee replacement with a Sigma System size 4 femur, size 5 tibia, 12.5-mm bridging bearing, and 41-mm all polyethylene patella.  SURGEON:  Harvie Junior, M.D.  ASSISTANT:  Marshia Ly, P.A.  ANESTHESIA:  General.  BRIEF HISTORY:  Ralph Kim is a 74 year old male with a long history of having complaints of left knee pain.  He had been treated conservatively for a prolonged period of time.  After failure of all conservative care, he was taken to the operating room for left total knee replacement. Preoperative imaging showed end-stage bone-on-bone change in the medial compartment.  He was having night pain as well as light activity pain. He had failed injection therapy and activity modification.  Because of failure of all conservative care, he was taken to the operating room for left total knee replacement.  DESCRIPTION OF PROCEDURE:  The patient was taken to the operating room. After adequate anesthesia was obtained with general anesthetic, the patient was placed supine on the operating table, and the left leg was prepped and draped in usual sterile fashion.  Following this, the leg was exsanguinated.  Blood pressure tourniquet inflated to 300 mmHg. Following this, a midline incision was made in subcutaneous tissues and taken down to the level of extensor mechanism.  A medial parapatellar arthrotomy was undertaken.  Once that was completed, attention was turned to the knee where the medial and  lateral meniscus removed, retropatellar fat pad, synovium anterior aspect of the femur, and anterior and posterior cruciates.  Once this was done, attention was turned to the femur where an intramedullary pilot hole was drilled and an intramedullary alignment guide was placed with a 5-degree long alignment inclination and an 11 mm of distal bone resection.  Once this was done, attention was turned to the femur, which was sized to a 4. Anterior and posterior cuts were made, chamfers and box.  Attention turned to the tibia, sized to a 5, it was drilled and keeled.  Once this was done, the trial components were put in place and attention turned to the patella, cut down to a level of 13 mm and the paddle was placed and lugs were drilled into the paddle and a 41 all poly patella was placed. The knee was then put through a range of motion, excellent stability and range of motion were achieved at this point.  Once this was completed, attention was turned towards removal of all trial components, first it was tested in extension and flexion and all this was excellent, and the trial components were then removed.  The knee was copiously and thoroughly lavaged with pulsatile lavage irrigation and suctioned dry. The final components were cemented into place, size 5  tibia, size 4 femur, 12.5-mm bridging bearing trial was placed, and a 41 all poly patella was placed and held with a clamp.  Once this was completed, attention was turned towards Exparel and Exparel was placed throughout the knee and the synovial reflection for postoperative pain control. Once this was completed, attention was turned towards allowing the cement to completely harden and once it was and all excess cement had been removed, tourniquet was let down.  All bleeding was controlled with electrocautery.  A 15 poly was trialed and it did allow coming to extension and all 12.5 was then opened and placed and excellent range of motion  and stability were achieved at this point.  Medium Hemovac drain was placed and the deep layers closed with 1 Vicryl running, the skin with 0 and 2-0 Vicryl, 3-0 Monocryl subcuticular.  Benzoin and Steri- Strips applied, sterile compressive dressing was applied.  The patient was taken to recovery where he was noted to be in satisfactory condition.  Estimated blood loss for the procedure was minimal.     Harvie JuniorJohn L. Yarethzi Branan, M.D.     Ranae PlumberJLG/MEDQ  D:  03/30/2015  T:  03/30/2015  Job:  [id number]  cc:   Dr. Luiz BlareGraves' office

## 2015-03-30 NOTE — Anesthesia Preprocedure Evaluation (Signed)
Anesthesia Evaluation   Patient awake    Reviewed: Allergy & Precautions, NPO status , Patient's Chart, lab work & pertinent test results, reviewed documented beta blocker date and time   History of Anesthesia Complications Negative for: history of anesthetic complications  Airway Mallampati: II  TM Distance: >3 FB Neck ROM: Full    Dental  (+) Partial Upper, Teeth Intact   Pulmonary neg pulmonary ROS,    breath sounds clear to auscultation       Cardiovascular hypertension, Pt. on medications and Pt. on home beta blockers (-) angina(-) CABG and (-) CHF (-) dysrhythmias  Rhythm:Regular     Neuro/Psych negative neurological ROS  negative psych ROS   GI/Hepatic negative GI ROS, Neg liver ROS,   Endo/Other  negative endocrine ROS  Renal/GU negative Renal ROS     Musculoskeletal   Abdominal   Peds  Hematology negative hematology ROS (+)   Anesthesia Other Findings   Reproductive/Obstetrics                             Anesthesia Physical Anesthesia Plan  ASA: II  Anesthesia Plan: MAC and Spinal   Post-op Pain Management:    Induction: Intravenous  Airway Management Planned: Nasal Cannula, Natural Airway and Simple Face Mask  Additional Equipment: None  Intra-op Plan:   Post-operative Plan:   Informed Consent: I have reviewed the patients History and Physical, chart, labs and discussed the procedure including the risks, benefits and alternatives for the proposed anesthesia with the patient or authorized representative who has indicated his/her understanding and acceptance.   Dental advisory given  Plan Discussed with: CRNA and Surgeon  Anesthesia Plan Comments:         Anesthesia Quick Evaluation

## 2015-03-30 NOTE — H&P (Signed)
TOTAL KNEE ADMISSION H&P  Patient is being admitted for left total knee arthroplasty.  Subjective:  Chief Complaint:left knee pain.  HPI: Ralph Kim, 74 y.o. male, has a history of pain and functional disability in the left knee due to arthritis and has failed non-surgical conservative treatments for greater than 12 weeks to includeNSAID's and/or analgesics, corticosteriod injections, viscosupplementation injections, weight reduction as appropriate and activity modification.  Onset of symptoms was gradual, starting 5 years ago with gradually worsening course since that time. The patient noted no past surgery on the left knee(s).  Patient currently rates pain in the left knee(s) at 8 out of 10 with activity. Patient has night pain, worsening of pain with activity and weight bearing, pain that interferes with activities of daily living, pain with passive range of motion and joint swelling.  Patient has evidence of subchondral sclerosis, periarticular osteophytes and joint space narrowing by imaging studies. This patient has had failure of all reasonable conservative care. There is no active infection.  Patient Active Problem List   Diagnosis Date Noted  . Preoperative clearance 02/03/2015  . Osteoarthritis of left knee 01/10/2014   Past Medical History  Diagnosis Date  . Liver cyst   . Kidney cysts   . Hypertension   . Osteoarthritis   . Bronchitis, acute     PMH    Past Surgical History  Procedure Laterality Date  . Wisdom tooth extraction    . Cyst excision      on back    Prescriptions prior to admission  Medication Sig Dispense Refill Last Dose  . carvedilol (COREG) 12.5 MG tablet Take 1 tablet (12.5 mg total) by mouth 2 (two) times daily with a meal. 60 tablet 3 Taking  . Diclofenac Sodium (PENNSAID TD) Place 1 application onto the skin as needed. Left knee     . naproxen sodium (ALEVE) 220 MG tablet Take 220 mg by mouth 2 (two) times daily as needed.     . TURMERIC PO Take 1  capsule by mouth daily.      Allergies  Allergen Reactions  . Cortizone-10 [Hydrocortisone] Hives and Swelling  . Celebrex [Celecoxib] Rash    Social History  Substance Use Topics  . Smoking status: Never Smoker   . Smokeless tobacco: Never Used  . Alcohol Use: No    Family History  Problem Relation Age of Onset  . Cancer Brother      ROS ROS: I have reviewed the patient's review of systems thoroughly and there are no positive responses as relates to the HPI. Objective:  Physical Exam  Vital signs in last 24 hours: Temp:  [97.4 F (36.3 C)] 97.4 F (36.3 C) (11/14 1058) Pulse Rate:  [62] 62 (11/14 1058) Resp:  [20] 20 (11/14 1058) BP: (210)/(90) 210/90 mmHg (11/14 1059) SpO2:  [99 %] 99 % (11/14 1058) Weight:  [91.173 kg (201 lb)] 91.173 kg (201 lb) (11/14 1058) Well-developed well-nourished patient in no acute distress. Alert and oriented x3 HEENT:within normal limits Cardiac: Regular rate and rhythm Pulmonary: Lungs clear to auscultation Abdomen: Soft and nontender.  Normal active bowel sounds  Musculoskeletal: (Left knee: Painful range of motion.  No instability.  Limited range of motion of 5-100.  Significant medial joint line tenderness. Labs: Recent Results (from the past 2160 hour(s))  CBC     Status: None   Collection Time: 02/03/15 10:46 AM  Result Value Ref Range   WBC 4.7 4.0 - 10.5 K/uL   RBC 4.71 4.22 -  5.81 MIL/uL   Hemoglobin 14.9 13.0 - 17.0 g/dL   HCT 42.8 39.0 - 52.0 %   MCV 90.9 78.0 - 100.0 fL   MCH 31.6 26.0 - 34.0 pg   MCHC 34.8 30.0 - 36.0 g/dL   RDW 13.9 11.5 - 15.5 %   Platelets 210 150 - 400 K/uL   MPV 10.6 8.6 - 12.4 fL  Comprehensive metabolic panel     Status: None   Collection Time: 02/03/15 10:46 AM  Result Value Ref Range   Sodium 140 135 - 146 mmol/L   Potassium 4.2 3.5 - 5.3 mmol/L   Chloride 105 98 - 110 mmol/L   CO2 25 20 - 31 mmol/L   Glucose, Bld 94 65 - 99 mg/dL   BUN 14 7 - 25 mg/dL   Creat 0.83 0.70 - 1.18 mg/dL    Total Bilirubin 0.5 0.2 - 1.2 mg/dL   Alkaline Phosphatase 67 40 - 115 U/L   AST 16 10 - 35 U/L   ALT 14 9 - 46 U/L   Total Protein 6.5 6.1 - 8.1 g/dL   Albumin 4.3 3.6 - 5.1 g/dL   Calcium 9.1 8.6 - 10.3 mg/dL    Comment: ** Please note change in unit of measure and reference range(s). **     Protime-INR     Status: None   Collection Time: 02/03/15 10:46 AM  Result Value Ref Range   Prothrombin Time 14.3 11.6 - 15.2 seconds   INR 1.10 <1.50    Comment: The INR is of principal utility in following patients on stable doses of oral anticoagulants.  The therapeutic range is generally 2.0 to 3.0, but may be 3.0 to 4.0 in patients with mechanical cardiac valves, recurrent embolisms and antiphospholipid antibodies (including lupus inhibitors).   APTT     Status: None   Collection Time: 02/03/15 10:46 AM  Result Value Ref Range   aPTT 30 24 - 37 seconds    Comment: This test is for screening purposes only; it should not be used for therapeutic unfractionated heparin monitoring.  Please refer to Heparin Anti-Xa (67591).   Surgical pcr screen     Status: None   Collection Time: 03/25/15  2:20 PM  Result Value Ref Range   MRSA, PCR NEGATIVE NEGATIVE   Staphylococcus aureus NEGATIVE NEGATIVE    Comment:        The Xpert SA Assay (FDA approved for NASAL specimens in patients over 58 years of age), is one component of a comprehensive surveillance program.  Test performance has been validated by [facility] for patients greater than or equal to 61 year old. It is not intended to diagnose infection nor to guide or monitor treatment.   Urinalysis, Routine w reflex microscopic (not at [facility])     Status: None   Collection Time: 03/25/15  2:20 PM  Result Value Ref Range   Color, Urine YELLOW YELLOW   APPearance CLEAR CLEAR   Specific Gravity, Urine 1.013 1.005 - 1.030   pH 6.5 5.0 - 8.0   Glucose, UA NEGATIVE NEGATIVE mg/dL   Hgb urine dipstick NEGATIVE NEGATIVE   Bilirubin  Urine NEGATIVE NEGATIVE   Ketones, ur NEGATIVE NEGATIVE mg/dL   Protein, ur NEGATIVE NEGATIVE mg/dL   Urobilinogen, UA 1.0 0.0 - 1.0 mg/dL   Nitrite NEGATIVE NEGATIVE   Leukocytes, UA NEGATIVE NEGATIVE    Comment: MICROSCOPIC NOT DONE ON URINES WITH NEGATIVE PROTEIN, BLOOD, LEUKOCYTES, NITRITE, OR GLUCOSE <1000 mg/dL.  APTT  Status: None   Collection Time: 03/25/15  2:21 PM  Result Value Ref Range   aPTT 27 24 - 37 seconds  CBC WITH DIFFERENTIAL     Status: None   Collection Time: 03/25/15  2:21 PM  Result Value Ref Range   WBC 5.7 4.0 - 10.5 K/uL   RBC 4.81 4.22 - 5.81 MIL/uL   Hemoglobin 14.9 13.0 - 17.0 g/dL   HCT 44.2 39.0 - 52.0 %   MCV 91.9 78.0 - 100.0 fL   MCH 31.0 26.0 - 34.0 pg   MCHC 33.7 30.0 - 36.0 g/dL   RDW 12.8 11.5 - 15.5 %   Platelets 220 150 - 400 K/uL   Neutrophils Relative % 69 %   Neutro Abs 4.0 1.7 - 7.7 K/uL   Lymphocytes Relative 20 %   Lymphs Abs 1.1 0.7 - 4.0 K/uL   Monocytes Relative 8 %   Monocytes Absolute 0.4 0.1 - 1.0 K/uL   Eosinophils Relative 3 %   Eosinophils Absolute 0.2 0.0 - 0.7 K/uL   Basophils Relative 0 %   Basophils Absolute 0.0 0.0 - 0.1 K/uL  Comprehensive metabolic panel     Status: Abnormal   Collection Time: 03/25/15  2:21 PM  Result Value Ref Range   Sodium 138 135 - 145 mmol/L   Potassium 4.6 3.5 - 5.1 mmol/L   Chloride 108 101 - 111 mmol/L   CO2 22 22 - 32 mmol/L   Glucose, Bld 104 (H) 65 - 99 mg/dL   BUN 11 6 - 20 mg/dL   Creatinine, Ser 0.82 0.61 - 1.24 mg/dL   Calcium 9.5 8.9 - 10.3 mg/dL   Total Protein 6.6 6.5 - 8.1 g/dL   Albumin 3.9 3.5 - 5.0 g/dL   AST 23 15 - 41 U/L   ALT 20 17 - 63 U/L   Alkaline Phosphatase 66 38 - 126 U/L   Total Bilirubin 0.4 0.3 - 1.2 mg/dL   GFR calc non Af Amer >60 >60 mL/min   GFR calc Af Amer >60 >60 mL/min    Comment: (NOTE) The eGFR has been calculated using the CKD EPI equation. This calculation has not been validated in all clinical situations. eGFR's persistently <60  mL/min signify possible Chronic Kidney Disease.    Anion gap 8 5 - 15  Protime-INR     Status: None   Collection Time: 03/25/15  2:21 PM  Result Value Ref Range   Prothrombin Time 14.3 11.6 - 15.2 seconds   INR 1.09 0.00 - 1.49  Type and screen Order type and screen if day of surgery is less than 15 days from draw of preadmission visit or order morning of surgery if day of surgery is greater than 6 days from preadmission visit.     Status: None   Collection Time: 03/25/15  2:37 PM  Result Value Ref Range   ABO/RH(D) A POS    Antibody Screen NEG    Sample Expiration 04/08/2015    Extend sample reason NO TRANSFUSIONS OR PREGNANCY IN THE PAST 3 MONTHS   ABO/Rh     Status: None   Collection Time: 03/25/15  2:37 PM  Result Value Ref Range   ABO/RH(D) A POS      Estimated body mass index is 29.67 kg/(m^2) as calculated from the following:   Height as of 03/25/15: 5' 9"  (1.753 m).   Weight as of this encounter: 91.173 kg (201 lb).   Imaging Review Plain radiographs demonstrate severe degenerative joint  disease of the left knee(s). The overall alignment ismild varus. The bone quality appears to be good for age and reported activity level.  Assessment/Plan:  End stage arthritis, left knee   The patient history, physical examination, clinical judgment of the provider and imaging studies are consistent with end stage degenerative joint disease of the left knee(s) and total knee arthroplasty is deemed medically necessary. The treatment options including medical management, injection therapy arthroscopy and arthroplasty were discussed at length. The risks and benefits of total knee arthroplasty were presented and reviewed. The risks due to aseptic loosening, infection, stiffness, patella tracking problems, thromboembolic complications and other imponderables were discussed. The patient acknowledged the explanation, agreed to proceed with the plan and consent was signed. Patient is being admitted  for inpatient treatment for surgery, pain control, PT, OT, prophylactic antibiotics, VTE prophylaxis, progressive ambulation and ADL's and discharge planning. The patient is planning to be discharged home with home health services

## 2015-03-31 ENCOUNTER — Encounter (HOSPITAL_COMMUNITY): Payer: Self-pay | Admitting: Orthopedic Surgery

## 2015-03-31 LAB — BASIC METABOLIC PANEL
Anion gap: 8 (ref 5–15)
BUN: 10 mg/dL (ref 6–20)
CO2: 23 mmol/L (ref 22–32)
Calcium: 6.9 mg/dL — ABNORMAL LOW (ref 8.9–10.3)
Chloride: 106 mmol/L (ref 101–111)
Creatinine, Ser: 0.97 mg/dL (ref 0.61–1.24)
GFR calc Af Amer: >60 mL/min (ref >60)
GLUCOSE: 213 mg/dL — AB (ref 65–99)
POTASSIUM: 3.9 mmol/L (ref 3.5–5.1)
Sodium: 137 mmol/L (ref 135–145)

## 2015-03-31 LAB — CBC
HCT: 31.5 % — ABNORMAL LOW (ref 39.0–52.0)
Hemoglobin: 10.5 g/dL — ABNORMAL LOW (ref 13.0–17.0)
MCH: 30.7 pg (ref 26.0–34.0)
MCHC: 33.3 g/dL (ref 30.0–36.0)
MCV: 92.1 fL (ref 78.0–100.0)
PLATELETS: 183 10*3/uL (ref 150–400)
RBC: 3.42 MIL/uL — AB (ref 4.22–5.81)
RDW: 12.9 % (ref 11.5–15.5)
WBC: 7.7 10*3/uL (ref 4.0–10.5)

## 2015-03-31 MED ORDER — OXYCODONE HCL ER 10 MG PO T12A
20.0000 mg | EXTENDED_RELEASE_TABLET | Freq: Two times a day (BID) | ORAL | Status: DC
  Administered 2015-03-31 – 2015-04-01 (×3): 20 mg via ORAL
  Filled 2015-03-31 (×3): qty 2

## 2015-03-31 MED ORDER — OXYCODONE HCL 5 MG PO TABS
5.0000 mg | ORAL_TABLET | ORAL | Status: DC | PRN
  Administered 2015-03-31 – 2015-04-01 (×3): 10 mg via ORAL
  Filled 2015-03-31 (×3): qty 2

## 2015-03-31 NOTE — Evaluation (Signed)
Physical Therapy Evaluation Patient Details Name: Mickie HillierGlenn Sigl MRN: [id number] DOB: May 24, 1940 Today's Date: 03/31/2015   History of Present Illness  74 y.o. male now s/p Lt TKA. PMH: Hypertension  Clinical Impression  Pt is s/p TKA resulting in the deficits listed below (see PT Problem List). Pt will benefit from skilled PT to increase their independence and safety with mobility to allow discharge to home with family support. Patient able to ambulate 50 feet with rw and min guard assistance. Will continue to progress mobility and independence as tolerated.       Follow Up Recommendations Home health PT;Supervision for mobility/OOB    Equipment Recommendations  None recommended by PT;Other (comment) (reports having rw at home)    Recommendations for Other Services       Precautions / Restrictions Precautions Precautions: Fall;Knee Precaution Booklet Issued: Yes (comment) Precaution Comments: HEP provided, reviewed no pillows under knee Required Braces or Orthoses: Knee Immobilizer - Left Knee Immobilizer - Left: On when out of bed or walking Restrictions Weight Bearing Restrictions: Yes LLE Weight Bearing: Weight bearing as tolerated      Mobility  Bed Mobility Overal bed mobility: Needs Assistance Bed Mobility: Supine to Sit     Supine to sit: HOB elevated;Min guard     General bed mobility comments: using rail and HOB eleveated.   Transfers Overall transfer level: Needs assistance Equipment used: Rolling walker (2 wheeled) Transfers: Sit to/from Stand Sit to Stand: Min guard         General transfer comment: cues for hand placement.   Ambulation/Gait Ambulation/Gait assistance: Min guard Ambulation Distance (Feet): 50 Feet Assistive device: Rolling walker (2 wheeled) Gait Pattern/deviations: Step-to pattern;Decreased stance time - left;Decreased weight shift to left Gait velocity: decreased   General Gait Details: no loss of balance during  session.  Stairs            Wheelchair Mobility    Modified Rankin (Stroke Patients Only)       Balance Overall balance assessment: Needs assistance Sitting-balance support: No upper extremity supported Sitting balance-Leahy Scale: Fair     Standing balance support: Bilateral upper extremity supported Standing balance-Leahy Scale: Good Standing balance comment: using rw                             Pertinent Vitals/Pain Pain Assessment: 0-10 Pain Score: 8  Pain Location: Lt knee Pain Descriptors / Indicators: Aching Pain Intervention(s): Monitored during session;Limited activity within patient's tolerance    Home Living Family/patient expects to be discharged to:: Private residence Living Arrangements: Spouse/significant other Available Help at Discharge: Family;Available 24 hours/day Type of Home: House Home Access: Stairs to enter Entrance Stairs-Rails: Right Entrance Stairs-Number of Steps: 2 Home Layout: One level Home Equipment: Walker - 2 wheels      Prior Function Level of Independence: Independent               Hand Dominance        Extremity/Trunk Assessment               Lower Extremity Assessment: LLE deficits/detail   LLE Deficits / Details: fair quad activation     Communication   Communication: No difficulties  Cognition Arousal/Alertness: Awake/alert Behavior During Therapy: WFL for tasks assessed/performed Overall Cognitive Status: Within Functional Limits for tasks assessed                      General Comments  Exercises Total Joint Exercises Ankle Circles/Pumps: AROM;Both;15 reps Quad Sets: Strengthening;Left;10 reps Gluteal Sets: Strengthening;Both;10 reps Heel Slides: AAROM;Left;10 reps Goniometric ROM: knee flexion 46 degrees.      Assessment/Plan    PT Assessment Patient needs continued PT services  PT Diagnosis Difficulty walking;Acute pain   PT Problem List Decreased  strength;Decreased range of motion;Decreased activity tolerance;Decreased balance;Decreased mobility;Decreased knowledge of use of DME;Pain  PT Treatment Interventions DME instruction;Stair training;Gait training;Functional mobility training;Therapeutic activities;Therapeutic exercise;Balance training;Patient/family education   PT Goals (Current goals can be found in the Care Plan section) Acute Rehab PT Goals Patient Stated Goal: go home from hospital PT Goal Formulation: With patient Time For Goal Achievement: 04/14/15 Potential to Achieve Goals: Good    Frequency 7X/week   Barriers to discharge        Co-evaluation               End of Session Equipment Utilized During Treatment: Gait belt;Left knee immobilizer Activity Tolerance: Patient tolerated treatment well Patient left: in chair;with call bell/phone within reach;with family/visitor present;Other (comment) (in knee extension stretch.) Nurse Communication: Mobility status;Weight bearing status         Time: 1004-1036 PT Time Calculation (min) (ACUTE ONLY): 32 min   Charges:   PT Evaluation $Initial PT Evaluation Tier I: 1 Procedure PT Treatments $Gait Training: 8-22 mins   PT G Codes:        Christiane Ha, PT, CSCS Pager [phone] Office [phone]  03/31/2015, 12:30 PM

## 2015-03-31 NOTE — Progress Notes (Signed)
Utilization review completed.  

## 2015-03-31 NOTE — Progress Notes (Signed)
Subjective: 1 Day Post-Op Procedure(s) (LRB): TOTAL KNEE ARTHROPLASTY (Left) Patient reports pain as moderate. Taking by mouth and voiding okay. No flatus yet.   Objective: Vital signs in last 24 hours: Temp:  [97.4 F (36.3 C)-98.1 F (36.7 C)] 98.1 F (36.7 C) (11/15 0533) Pulse Rate:  [62-79] 79 (11/15 0533) Resp:  [9-20] 18 (11/15 0533) BP: (69-210)/(41-98) 138/71 mmHg (11/15 0533) SpO2:  [94 %-100 %] 100 % (11/15 0533) Weight:  [91.173 kg (201 lb)] 91.173 kg (201 lb) (11/14 1058)  Intake/Output from previous day: 11/14 0701 - 11/15 0700 In: 3591.7 [P.O.:140; I.V.:3301.7; IV Piggyback:150] Out: 1175 [Urine:825; Drains:350] Intake/Output this shift: Total I/O In: 240 [P.O.:240] Out: 100 [Urine:100]   Recent Labs  03/31/15 0550  HGB 10.5*    Recent Labs  03/31/15 0550  WBC 7.7  RBC 3.42*  HCT 31.5*  PLT 183    Recent Labs  03/31/15 0550  NA 137  K 3.9  CL 106  CO2 23  BUN 10  CREATININE 0.97  GLUCOSE 213*  CALCIUM 6.9*   No results for input(s): LABPT, INR in the last 72 hours. Left knee exam: Neurovascular intact Sensation intact distally Intact pulses distally Dorsiflexion/Plantar flexion intact Incision: dressing C/D/I Compartment soft Hemovac drain intact. Assessment/Plan: 1 Day Post-Op Procedure(s) (LRB): TOTAL KNEE ARTHROPLASTY (Left) Plan: Hemovac drain pulled. Up with therapy Plan for discharge tomorrow Aspirin 325 mg twice daily with food for DVT prophylaxis along with SCDs.  Marlyn Rabine G 03/31/2015, 9:45 AM

## 2015-03-31 NOTE — Progress Notes (Signed)
Physical Therapy Treatment Patient Details Name: Ralph Kim MRN: [id number] DOB: 1940-07-01 Today's Date: 03/31/2015    History of Present Illness 74 y.o. male now s/p Lt TKA. PMH: Hypertension    PT Comments    Patient with reports of increased pain during second PT session. Patient ambulating 30 feet with min guard and rw, reporting distance limited by pain. Will continue to progress mobility and independence in anticipation for D/C to home with family assistance.   Follow Up Recommendations  Home health PT;Supervision for mobility/OOB     Equipment Recommendations  None recommended by PT    Recommendations for Other Services       Precautions / Restrictions Precautions Precautions: Fall;Knee Required Braces or Orthoses: Knee Immobilizer - Left Knee Immobilizer - Left: On when out of bed or walking Restrictions Weight Bearing Restrictions: Yes LLE Weight Bearing: Weight bearing as tolerated    Mobility  Bed Mobility Overal bed mobility: Needs Assistance Bed Mobility: Supine to Sit     Supine to sit: Supervision     General bed mobility comments: HOB flat  Transfers Overall transfer level: Needs assistance Equipment used: None Transfers: Sit to/from Stand Sit to Stand: Min guard         General transfer comment: Patient standing with use of bed rail to use urinal.   Ambulation/Gait Ambulation/Gait assistance: Min guard Ambulation Distance (Feet): 30 Feet Assistive device: Rolling walker (2 wheeled) Gait Pattern/deviations: Step-to pattern;Decreased stance time - left;Decreased weight shift to left Gait velocity: decreased   General Gait Details: Patient reporting increased pain, limiting ambulation distance.   Stairs            Wheelchair Mobility    Modified Rankin (Stroke Patients Only)       Balance Overall balance assessment: Needs assistance Sitting-balance support: No upper extremity supported Sitting balance-Leahy Scale: Good     Standing balance support: Single extremity supported Standing balance-Leahy Scale: Poor Standing balance comment: using rail for support                    Cognition Arousal/Alertness: Awake/alert Behavior During Therapy: WFL for tasks assessed/performed Overall Cognitive Status: Within Functional Limits for tasks assessed                      Exercises Total Joint Exercises Ankle Circles/Pumps: AROM;Both;15 reps Quad Sets: Strengthening;Left;10 reps Heel Slides: AAROM;Left;10 reps Hip ABduction/ADduction: Strengthening;Left;10 reps Straight Leg Raises: Strengthening;Left;10 reps    General Comments        Pertinent Vitals/Pain Pain Assessment: 0-10 Pain Score: 8  Pain Location: Lt knee Pain Descriptors / Indicators: Aching Pain Intervention(s): Monitored during session;Limited activity within patient's tolerance    Home Living                      Prior Function            PT Goals (current goals can now be found in the care plan section) Acute Rehab PT Goals Patient Stated Goal: go home from hospital PT Goal Formulation: With patient Time For Goal Achievement: 04/14/15 Potential to Achieve Goals: Good Progress towards PT goals: Progressing toward goals    Frequency  7X/week    PT Plan Current plan remains appropriate    Co-evaluation             End of Session Equipment Utilized During Treatment: Gait belt;Left knee immobilizer Activity Tolerance: Patient limited by pain Patient left: in chair;with call bell/phone  within reach;Other (comment) (in knee extension)     Time: 9604-5409 PT Time Calculation (min) (ACUTE ONLY): 28 min  Charges:  $Gait Training: 8-22 mins $Therapeutic Exercise: 8-22 mins                    G Codes:      Christiane Ha, PT, CSCS Pager [phone] Office [phone]  03/31/2015, 4:29 PM

## 2015-03-31 NOTE — Anesthesia Postprocedure Evaluation (Addendum)
Anesthesia Post Note  Patient: Ralph Kim  Procedure(s) Performed: Procedure(s) (LRB): TOTAL KNEE ARTHROPLASTY (Left)  Anesthesia type: Spinal converted to general  Patient location: PACU  Post pain: Pain level controlled  Post assessment: Post-op Vital signs reviewed  Last Vitals: BP 138/71 mmHg  Pulse 79  Temp(Src) 36.7 C (Oral)  Resp 18  Wt 201 lb (91.173 kg)  SpO2 100%  Post vital signs: Reviewed  Level of consciousness: sedated  Complications: No apparent anesthesia complications

## 2015-04-01 LAB — CBC
HEMATOCRIT: 32.1 % — AB (ref 39.0–52.0)
Hemoglobin: 11.1 g/dL — ABNORMAL LOW (ref 13.0–17.0)
MCH: 31.9 pg (ref 26.0–34.0)
MCHC: 34.6 g/dL (ref 30.0–36.0)
MCV: 92.2 fL (ref 78.0–100.0)
PLATELETS: 170 10*3/uL (ref 150–400)
RBC: 3.48 MIL/uL — AB (ref 4.22–5.81)
RDW: 12.9 % (ref 11.5–15.5)
WBC: 8 10*3/uL (ref 4.0–10.5)

## 2015-04-01 MED ORDER — OXYCODONE HCL ER 20 MG PO T12A: 20.0000 mg | EXTENDED_RELEASE_TABLET | Freq: Two times a day (BID) | ORAL | Status: DC

## 2015-04-01 NOTE — Care Management Note (Signed)
Case Management Note  Patient Details  Name: Ralph Kim MRN: [id number] Date of Birth: 12/27/1940  Subjective/Objective:     S/p left total knee arthroplasty               Action/Plan: Spoke with patient about HHC, he chose [facility].Contacted [facility] at [facility] and set up HHPT. Patient stated that [employer] delivered CPM, rolling walker and 3N1 to home. Patient stated that his wife will be able to assist him after discharge. No other d/c needs identified.   Expected Discharge Date:                  Expected Discharge Plan:  Home w Home Health Services  In-House Referral:  NA  Discharge planning Services  CM Consult  Post Acute Care Choice:  Durable Medical Equipment, Home Health Choice offered to:  Patient  DME Arranged:  3-N-1, [place], Walker rolling DME Agency:  [employer]  HH Arranged:  PT HH Agency:  [facility]  Status of Service:  Completed, signed off  Medicare Important Message Given:    Date Medicare IM Given:    Medicare IM give by:    Date Additional Medicare IM Given:    Additional Medicare Important Message give by:     If discussed at Long Length of Stay Meetings, dates discussed:    Additional Comments:  Ralph Kim, Ralph Piechota Watson, RN 04/01/2015, 8:50 AM

## 2015-04-01 NOTE — Progress Notes (Signed)
Physical Therapy Treatment Patient Details Name: Ralph Kim MRN: [id number] DOB: [birth year] Today's Date: 04/01/2015    History of Present Illness 74 y.o. male now s/p Lt TKA. PMH: Hypertension    PT Comments    Patient is making good progress with PT.  From a mobility standpoint anticipate patient will be ready for DC home with family support. Patient able to complete stairs, HEP review and stairs. Patient denies any questions or concerns following session.       Follow Up Recommendations  Home health PT;Supervision for mobility/OOB     Equipment Recommendations  None recommended by PT    Recommendations for Other Services       Precautions / Restrictions Precautions Precautions: Knee Required Braces or Orthoses: Knee Immobilizer - Left Knee Immobilizer - Left: On when out of bed or walking Restrictions Weight Bearing Restrictions: Yes LLE Weight Bearing: Weight bearing as tolerated    Mobility  Bed Mobility Overal bed mobility: Needs Assistance Bed Mobility: Supine to Sit     Supine to sit: Independent Sit to supine: Independent   General bed mobility comments: HOB flat  Transfers Overall transfer level: Needs assistance Equipment used: None Transfers: Sit to/from Stand Sit to Stand: Modified independent (Device/Increase time)         General transfer comment: using bed to push from for standing  Ambulation/Gait Ambulation/Gait assistance: Supervision Ambulation Distance (Feet): 125 Feet Assistive device: Rolling walker (2 wheeled) Gait Pattern/deviations: Step-through pattern;Decreased stance time - left;Decreased weight shift to left Gait velocity: decreased   General Gait Details: patient reporting still sore with full weight through LLE, encouraging progression with weight bearing as tolerated.    Stairs Stairs: Yes Stairs assistance: Min guard Stair Management: Two rails;Step to pattern Number of Stairs: 2 General stair comments: Patient  reporting that he will have two rails to use at home. Denies any concerns with going up/down stairs at home.   Wheelchair Mobility    Modified Rankin (Stroke Patients Only)       Balance Overall balance assessment: Needs assistance Sitting-balance support: No upper extremity supported Sitting balance-Leahy Scale: Good     Standing balance support: Single extremity supported Standing balance-Leahy Scale: Fair                      Cognition Arousal/Alertness: Awake/alert Behavior During Therapy: WFL for tasks assessed/performed Overall Cognitive Status: Within Functional Limits for tasks assessed                      Exercises Total Joint Exercises Ankle Circles/Pumps: AROM;Both;15 reps Quad Sets: Strengthening;Left;10 reps Short Arc Quad: Strengthening;Left;10 reps Heel Slides: AAROM;Left;10 reps Hip ABduction/ADduction: Strengthening;Left;10 reps Straight Leg Raises: Strengthening;Left;10 reps Goniometric ROM: 85 degrees flexion    General Comments        Pertinent Vitals/Pain Pain Assessment: 0-10 Pain Score: 4  Pain Location: Lt knee Pain Descriptors / Indicators: Sore Pain Intervention(s): Monitored during session;Limited activity within patient's tolerance    Home Living                      Prior Function            PT Goals (current goals can now be found in the care plan section) Acute Rehab PT Goals Patient Stated Goal: go home today PT Goal Formulation: With patient Time For Goal Achievement: 04/14/15 Potential to Achieve Goals: Good Progress towards PT goals: Progressing toward goals    Frequency  7X/week    PT Plan Current plan remains appropriate    Co-evaluation             End of Session Equipment Utilized During Treatment: Gait belt;Left knee immobilizer Activity Tolerance: Patient tolerated treatment well Patient left: in chair;with call bell/phone within reach     Time: [id number] PT Time  Calculation (min) (ACUTE ONLY): 33 min  Charges:  $Gait Training: 8-22 mins $Therapeutic Exercise: 8-22 mins                    G Codes:      Christiane HaBenjamin J. Evelean Bigler, PT, CSCS Pager [phone] Office [phone]  04/01/2015, 9:21 AM

## 2015-04-01 NOTE — Progress Notes (Signed)
Subjective: 2 Days Post-Op Procedure(s) (LRB): TOTAL KNEE ARTHROPLASTY (Left) Patient reports pain as moderate.  Had increased knee pain yesterday. Much better since we added OxyContin 20 mg twice daily. Taking by mouth and voiding okay. Reports he is ready to go home.  Objective: Vital signs in last 24 hours: Temp:  [98.4 F (36.9 C)-99.1 F (37.3 C)] 99 F (37.2 C) (11/16 0500) Pulse Rate:  [66-87] 87 (11/16 0500) Resp:  [17-20] 20 (11/16 0500) BP: (125-163)/(66-82) 163/72 mmHg (11/16 0500) SpO2:  [97 %-99 %] 97 % (11/16 0500)  Intake/Output from previous day: 11/15 0701 - 11/16 0700 In: 360 [P.O.:360] Out: 900 [Urine:900] Intake/Output this shift:     Recent Labs  03/31/15 0550 04/01/15 0514  HGB 10.5* 11.1*    Recent Labs  03/31/15 0550 04/01/15 0514  WBC 7.7 8.0  RBC 3.42* 3.48*  HCT 31.5* 32.1*  PLT 183 170    Recent Labs  03/31/15 0550  NA 137  K 3.9  CL 106  CO2 23  BUN 10  CREATININE 0.97  GLUCOSE 213*  CALCIUM 6.9*   No results for input(s): LABPT, INR in the last 72 hours. Left knee exam: Some drainage from drain hole onto dressing. No sign of infection. Neurovascular intact Sensation intact distally Intact pulses distally Dorsiflexion/Plantar flexion intact Incision: scant drainage No cellulitis present Compartment soft  Assessment/Plan: 2 Days Post-Op Procedure(s) (LRB): TOTAL KNEE ARTHROPLASTY (Left) Plan: Nursing staff to change dressing prior to discharge. Will add OxyContin 20 mg twice daily to discharge meds. Up with therapy Discharge home with home health Follow-up with Dr. Luiz BlareGraves in 2 weeks. Mikia Delaluz G 04/01/2015, 8:26 AM

## 2015-04-01 NOTE — Discharge Summary (Signed)
Patient ID: Ralph Kim Cybulski MRN: [id number] DOB/AGE: [birth year] 74 y.o.  Admit date: 03/30/2015 Discharge date: 04/01/2015  Admission Diagnoses:  Principal Problem:   Primary osteoarthritis of left knee   Discharge Diagnoses:  Same  Past Medical History  Diagnosis Date  . Liver cyst   . Kidney cysts   . Hypertension   . Osteoarthritis   . Bronchitis, acute     PMH    Surgeries: Procedure(s): Left TOTAL KNEE ARTHROPLASTY on 03/30/2015   Discharged Condition: Improved  Hospital Course: Ralph Kim Carp is an 74 y.o. male who was admitted 03/30/2015 for operative treatment ofPrimary osteoarthritis of left knee. Patient has severe unremitting pain that affects sleep, daily activities, and work/hobbies. After pre-op clearance the patient was taken to the operating room on 03/30/2015 and underwent  Procedure(s): Left TOTAL KNEE ARTHROPLASTY.    Patient was given perioperative antibiotics: Anti-infectives    Start     Dose/Rate Route Frequency Ordered Stop   03/30/15 2000  ceFAZolin (ANCEF) IVPB 2 g/50 mL premix     2 g 100 mL/hr over 30 Minutes Intravenous Every 6 hours 03/30/15 1805 03/31/15 0518   03/30/15 1230  ceFAZolin (ANCEF) IVPB 2 g/50 mL premix     2 g 100 mL/hr over 30 Minutes Intravenous To ShortStay Surgical 03/30/15 1116 03/30/15 1300       Patient was given sequential compression devices, early ambulation, and chemoprophylaxis to prevent DVT. On postoperative day #1 Percocet alone was not handling his pain. We added OxyContin 20 mg twice daily. On postop day #2 the patient was much more comfortable. He was making good progress with physical therapy. He was taking by mouth and voiding okay.  Patient benefited maximally from hospital stay and there were no complications.    Recent vital signs: Patient Vitals for the past 24 hrs:  BP Temp Temp src Pulse Resp SpO2  04/01/15 0500 (!) 163/72 mmHg 99 F (37.2 C) Oral 87 20 97 %  03/31/15 2148 (!) 161/82 mmHg 99.1 F (37.3  C) Oral 82 17 98 %  03/31/15 1730 (!) 155/82 mmHg 98.8 F (37.1 C) Oral 84 20 99 %  03/31/15 1400 (!) 146/70 mmHg 98.4 F (36.9 C) Oral 72 19 99 %  03/31/15 0958 125/66 mmHg 98.6 F (37 C) Oral 66 18 99 %     Recent laboratory studies:  Recent Labs  03/31/15 0550 04/01/15 0514  WBC 7.7 8.0  HGB 10.5* 11.1*  HCT 31.5* 32.1*  PLT 183 170  NA 137  --   K 3.9  --   CL 106  --   CO2 23  --   BUN 10  --   CREATININE 0.97  --   GLUCOSE 213*  --   CALCIUM 6.9*  --      Discharge Medications:     Medication List    STOP taking these medications        ALEVE 220 MG tablet  Generic drug:  naproxen sodium     PENNSAID TD      TAKE these medications        aspirin EC 325 MG tablet  Take 1 tablet (325 mg total) by mouth 2 (two) times daily after a meal. Take x 1 month post op to decrease risk of blood clots.     baclofen 10 MG tablet  Commonly known as:  LIORESAL  Take 1 tablet (10 mg total) by mouth every 8 (eight) hours as needed for muscle spasms.  carvedilol 12.5 MG tablet  Commonly known as:  COREG  Take 1 tablet (12.5 mg total) by mouth 2 (two) times daily with a meal.     oxyCODONE 20 mg 12 hr tablet  Commonly known as:  OXYCONTIN  Take 1 tablet (20 mg total) by mouth every 12 (twelve) hours.     oxyCODONE-acetaminophen 5-325 MG tablet  Commonly known as:  PERCOCET/ROXICET  Take 1-2 tablets by mouth every 4 (four) hours as needed for severe pain.     TURMERIC PO  Take 1 capsule by mouth daily.        Diagnostic Studies: Dg Chest 2 View  03/25/2015  CLINICAL DATA:  Pre operative respiratory exam. Osteoarthritis of the left knee. For total knee replacement. EXAM: CHEST  2 VIEW COMPARISON:  None. FINDINGS: The heart size and mediastinal contours are within normal limits. Both lungs are clear. The visualized skeletal structures are unremarkable. IMPRESSION: No active cardiopulmonary disease. Electronically Signed   By: Francene Boyers M.D.   On: 03/25/2015  16:33    Disposition: Home with home health therapy.      Discharge Instructions    CPM    Complete by:  As directed   Continuous passive motion machine (CPM):      Use the CPM from 0 to 70 for 8 hours per day.      You may increase by 5-10 per day.  You may break it up into 2 or 3 sessions per day.      Use CPM for 1-2 weeks or until you are told to stop.     Call MD / Call 911    Complete by:  As directed   If you experience chest pain or shortness of breath, CALL 911 and be transported to the hospital emergency room.  If you develope a fever above 101 F, pus (white drainage) or increased drainage or redness at the wound, or calf pain, call your surgeon's office.     Constipation Prevention    Complete by:  As directed   Drink plenty of fluids.  Prune juice may be helpful.  You may use a stool softener, such as Colace (over the counter) 100 mg twice a day.  Use MiraLax (over the counter) for constipation as needed.     Diet general    Complete by:  As directed      Do not put a pillow under the knee. Place it under the heel.    Complete by:  As directed      Increase activity slowly as tolerated    Complete by:  As directed      Weight bearing as tolerated    Complete by:  As directed   Laterality:  left  Extremity:  Lower           Follow-up Information    Follow up with GRAVES,JOHN L, MD. Schedule an appointment as soon as possible for a visit in 2 weeks.   Specialty:  Orthopedic Surgery   Contact information:   [address] [place] [address] [phone]        Signed: Matthew Folks 04/01/2015, 8:32 AM

## 2015-04-04 ENCOUNTER — Other Ambulatory Visit: Payer: Self-pay | Admitting: Orthopaedic Surgery

## 2015-04-04 ENCOUNTER — Ambulatory Visit (HOSPITAL_COMMUNITY)
Admission: RE | Admit: 2015-04-04 | Discharge: 2015-04-04 | Disposition: A | Discharge status: Home / Self Care | Payer: Medicare Other | Source: Ambulatory Visit | Attending: Orthopaedic Surgery | Admitting: Orthopaedic Surgery

## 2015-04-04 DIAGNOSIS — I10 Essential (primary) hypertension: Secondary | ICD-10-CM | POA: Diagnosis not present

## 2015-04-04 DIAGNOSIS — R52 Pain, unspecified: Secondary | ICD-10-CM | POA: Diagnosis not present

## 2015-04-04 DIAGNOSIS — M7989 Other specified soft tissue disorders: Secondary | ICD-10-CM | POA: Insufficient documentation

## 2015-04-04 NOTE — Progress Notes (Signed)
VASCULAR LAB PRELIMINARY  PRELIMINARY  PRELIMINARY  PRELIMINARY  Left lower extremity venous duplex completed.    Preliminary report:  There is no obvious evidence of DVT or SVT noted in the left lower extremity.  Jie Stickels, RVT 04/04/2015, 12:22 PM

## 2015-05-05 ENCOUNTER — Ambulatory Visit (HOSPITAL_COMMUNITY)
Admission: RE | Admit: 2015-05-05 | Discharge: 2015-05-05 | Disposition: A | Discharge status: Home / Self Care | Payer: Medicare Other | Source: Ambulatory Visit | Attending: Orthopaedic Surgery | Admitting: Orthopaedic Surgery

## 2015-05-05 ENCOUNTER — Other Ambulatory Visit (HOSPITAL_COMMUNITY): Payer: Self-pay | Admitting: Orthopedic Surgery

## 2015-05-05 DIAGNOSIS — R609 Edema, unspecified: Secondary | ICD-10-CM | POA: Diagnosis not present

## 2015-05-05 DIAGNOSIS — Z96652 Presence of left artificial knee joint: Secondary | ICD-10-CM | POA: Insufficient documentation

## 2015-05-05 DIAGNOSIS — M7989 Other specified soft tissue disorders: Secondary | ICD-10-CM | POA: Diagnosis present

## 2015-05-05 DIAGNOSIS — M79662 Pain in left lower leg: Secondary | ICD-10-CM | POA: Diagnosis not present

## 2015-05-05 NOTE — Progress Notes (Signed)
Preliminary results by tech - Left Lower Ext. Venous Duplex Completed. No evidence of deep and superficial vein thrombosis in the left leg.  Marilynne Halstedita Rino Hosea, BS, RDMS, RVT

## 2015-10-20 ENCOUNTER — Ambulatory Visit (INDEPENDENT_AMBULATORY_CARE_PROVIDER_SITE_OTHER): Payer: Medicare Other | Admitting: Sports Medicine

## 2015-10-20 ENCOUNTER — Encounter: Payer: Self-pay | Admitting: Sports Medicine

## 2015-10-20 ENCOUNTER — Ambulatory Visit (HOSPITAL_BASED_OUTPATIENT_CLINIC_OR_DEPARTMENT_OTHER)
Admission: RE | Admit: 2015-10-20 | Discharge: 2015-10-20 | Disposition: A | Discharge status: Home / Self Care | Payer: Medicare Other | Source: Ambulatory Visit | Attending: Sports Medicine | Admitting: Sports Medicine

## 2015-10-20 ENCOUNTER — Ambulatory Visit (INDEPENDENT_AMBULATORY_CARE_PROVIDER_SITE_OTHER): Payer: Medicare Other

## 2015-10-20 VITALS — BP 171/82 | HR 63 | Resp 18 | Wt 202.9 lb

## 2015-10-20 DIAGNOSIS — M7989 Other specified soft tissue disorders: Secondary | ICD-10-CM | POA: Insufficient documentation

## 2015-10-20 LAB — CBC WITH DIFFERENTIAL/PLATELET
Basophils Absolute: 0 cells/uL (ref 0–200)
Basophils Relative: 0 %
Eosinophils Absolute: 192 cells/uL (ref 15–500)
Eosinophils Relative: 3 %
HCT: 43 % (ref 38.5–50.0)
Hemoglobin: 14.7 g/dL (ref 13.2–17.1)
Lymphocytes Relative: 23 %
Lymphs Abs: 1472 cells/uL (ref 850–3900)
MCH: 30.9 pg (ref 27.0–33.0)
MCHC: 34.2 g/dL (ref 32.0–36.0)
MCV: 90.3 fL (ref 80.0–100.0)
MPV: 11.4 fL (ref 7.5–12.5)
Monocytes Absolute: 448 {cells}/uL (ref 200–950)
Monocytes Relative: 7 %
Neutro Abs: 4288 cells/uL (ref 1500–7800)
Neutrophils Relative %: 67 %
Platelets: 234 K/uL (ref 140–400)
RBC: 4.76 MIL/uL (ref 4.20–5.80)
RDW: 14.1 % (ref 11.0–15.0)
WBC: 6.4 K/uL (ref 3.8–10.8)

## 2015-10-20 MED ORDER — FUROSEMIDE 40 MG PO TABS: 40.0000 mg | ORAL_TABLET | Freq: Every day | ORAL | Status: DC

## 2015-10-20 NOTE — Assessment & Plan Note (Signed)
7 months post left total knee arthroplasty. CBC, CMP, urinalysis, BNP, d-dimer. Lower extremity Doppler ultrasound and x-rays of the tibia and the fibula. Adding furosemide 40 mg daily for 7 days, return to see me in one week. He will establish with Dr. Denyse Amassorey.

## 2015-10-20 NOTE — Progress Notes (Signed)
  Subjective:    CC: Left knee and leg pain  HPI: This is a pleasant [age] year old male, he is 7 months post left total knee arthroplasty, unfortunately has been plagued with persistent pain along the lateral joint line where previously it was medially with his arthritis, and persistent swelling and pain is entire left lower extremity. He does not see doctors, and he has not had a physical in some time. He had Doppler ultrasounds at the time of the surgery which were negative. No chest pain or shortness of breath, no constitutional symptoms  Past medical history, Surgical history, Family history not pertinant except as noted below, Social history, Allergies, and medications have been entered into the medical record, reviewed, and no changes needed.   Review of Systems: No fevers, chills, night sweats, weight loss, chest pain, or shortness of breath.   Objective:    General: Well Developed, well nourished, and in no acute distress.  Neuro: Alert and oriented x3, extra-ocular muscles intact, sensation grossly intact.  HEENT: Normocephalic, atraumatic, pupils equal round reactive to light, neck supple, no masses, no lymphadenopathy, thyroid nonpalpable.  Skin: Warm and dry, no rashes. Cardiac: Regular rate and rhythm, no murmurs rubs or gallops, no lower extremity edema.  Respiratory: Clear to auscultation bilaterally. Not using accessory muscles, speaking in full sentences. Left knee: Well-healed arthroplasty scar, good range of motion from 0-90, no palpable effusion, 2+ pitting edema in the lower extremity below the knee to the foot. Positive Homans sign. Also some swelling in the right side. Neurovascularly intact distally.  Impression and Recommendations:   I spent 25 minutes with this patient, greater than 50% was face-to-face time counseling regarding the above diagnoses

## 2015-10-21 LAB — URINALYSIS
Bilirubin Urine: NEGATIVE
Glucose, UA: NEGATIVE
Hgb urine dipstick: NEGATIVE
Ketones, ur: NEGATIVE
Leukocytes, UA: NEGATIVE
Nitrite: NEGATIVE
Protein, ur: NEGATIVE
Specific Gravity, Urine: 1.017 (ref 1.001–1.035)
pH: 6 (ref 5.0–8.0)

## 2015-10-21 LAB — COMPREHENSIVE METABOLIC PANEL
Albumin: 4.1 g/dL (ref 3.6–5.1)
CO2: 22 mmol/L (ref 20–31)
Calcium: 9 mg/dL (ref 8.6–10.3)
Chloride: 106 mmol/L (ref 98–110)
Potassium: 4.5 mmol/L (ref 3.5–5.3)
Total Protein: 6.7 g/dL (ref 6.1–8.1)

## 2015-10-21 LAB — COMPREHENSIVE METABOLIC PANEL WITH GFR
ALT: 10 U/L (ref 9–46)
AST: 16 U/L (ref 10–35)
Alkaline Phosphatase: 63 U/L (ref 40–115)
BUN: 13 mg/dL (ref 7–25)
Creat: 0.88 mg/dL (ref 0.70–1.18)
Glucose, Bld: 90 mg/dL (ref 65–99)
Sodium: 139 mmol/L (ref 135–146)
Total Bilirubin: 0.4 mg/dL (ref 0.2–1.2)

## 2015-10-21 LAB — BRAIN NATRIURETIC PEPTIDE: Brain Natriuretic Peptide: 10.1 pg/mL (ref <100)

## 2015-10-27 ENCOUNTER — Ambulatory Visit (INDEPENDENT_AMBULATORY_CARE_PROVIDER_SITE_OTHER): Payer: Medicare Other | Admitting: Sports Medicine

## 2015-10-27 VITALS — BP 163/79 | HR 76 | Resp 18 | Wt 202.4 lb

## 2015-10-27 DIAGNOSIS — M7989 Other specified soft tissue disorders: Secondary | ICD-10-CM | POA: Diagnosis not present

## 2015-10-27 DIAGNOSIS — M7740 Metatarsalgia, unspecified foot: Secondary | ICD-10-CM | POA: Insufficient documentation

## 2015-10-27 NOTE — Progress Notes (Signed)
  Subjective:    CC: Follow-up  HPI: Left knee swelling: No evidence of DVT however, looks collection in the popliteal fossa was noted questionably a Baker's cyst. We did Lasix as well and he had minimal improvement. He is here to consider injection.  Past medical history, Surgical history, Family history not pertinant except as noted below, Social history, Allergies, and medications have been entered into the medical record, reviewed, and no changes needed.   Review of Systems: No fevers, chills, night sweats, weight loss, chest pain, or shortness of breath.   Objective:    General: Well Developed, well nourished, and in no acute distress.  Neuro: Alert and oriented x3, extra-ocular muscles intact, sensation grossly intact.  HEENT: Normocephalic, atraumatic, pupils equal round reactive to light, neck supple, no masses, no lymphadenopathy, thyroid nonpalpable.  Skin: Warm and dry, no rashes. Cardiac: Regular rate and rhythm, no murmurs rubs or gallops, no lower extremity edema.  Respiratory: Clear to auscultation bilaterally. Not using accessory muscles, speaking in full sentences.  Procedure: Diagnostic Ultrasound of  left popliteal fossa Device: GE Logiq E  Findings: Noted hypoechoic change surrounding the similar memberand semitendinosus tendons, appears to be adjacent to the posterior medial joint line. Images permanently stored and available for review in the ultrasound unit.  Impression: Postoperative hematoma versus Baker cyst.  Procedure: Real-time Ultrasound Guided Injection of left popliteal fossa mass Device: GE Logiq E  Verbal informed consent obtained.  Time-out conducted.  Noted no overlying erythema, induration, or other signs of local infection.  Skin prepped in a sterile fashion.  Local anesthesia: Topical Ethyl chloride.  With sterile technique and under real time ultrasound guidance: Using an 18-gauge needle attempted to aspirate the collection, no fluid was  aspirated, syringe switched and injected 1 mL kenalog 40, 1 mL lidocaine.  Completed without difficulty  Pain immediately resolved suggesting accurate placement of the medication.  Advised to call if fevers/chills, erythema, induration, drainage, or persistent bleeding.  Images permanently stored and available for review in the ultrasound unit.  Impression: Technically successful ultrasound guided injection.  The entire left lower leg was then strapped with compressive dressing.  Impression and Recommendations:

## 2015-10-27 NOTE — Assessment & Plan Note (Signed)
Return with custom orthotics, and I will add metatarsal pads.

## 2015-10-27 NOTE — Assessment & Plan Note (Signed)
Doppler showed no evidence of DVT, only minimal improvement with Lasix 40. There was what appeared to be a complex Baker's cyst, attempted aspiration and injection today. Return in one month.

## 2015-10-29 ENCOUNTER — Telehealth: Payer: Self-pay

## 2015-10-29 NOTE — Telephone Encounter (Signed)
There is no such thing as an allergy to steroids, in fact steroids are the treatment for allergy. Just watch it for now, also take another 7 days of Lasix. Some people can get a rash from the effect of the steroid itself (not a true allergic reaction), or preservatives that are used for the medication.

## 2015-10-29 NOTE — Telephone Encounter (Signed)
Pt's wife notified.

## 2015-10-30 ENCOUNTER — Emergency Department
Admission: EM | Admit: 2015-10-30 | Discharge: 2015-10-30 | Disposition: A | Discharge status: Home / Self Care | Payer: Medicare Other | Source: Home / Self Care | Attending: Family Medicine | Admitting: Family Medicine

## 2015-10-30 ENCOUNTER — Encounter: Payer: Self-pay | Admitting: Emergency Medicine

## 2015-10-30 DIAGNOSIS — R21 Rash and other nonspecific skin eruption: Secondary | ICD-10-CM | POA: Diagnosis not present

## 2015-10-30 MED ORDER — HYDROXYZINE HCL 25 MG PO TABS: 25.0000 mg | ORAL_TABLET | Freq: Three times a day (TID) | ORAL | Status: DC | PRN

## 2015-10-30 NOTE — ED Notes (Signed)
Report rash developing past 2 days.

## 2015-10-30 NOTE — Discharge Instructions (Signed)
Atarax (hydroxizine) is an antihistamine that can be taken to help with itching. This medication can cause drowsiness so do not drive or drink alcohol while taking.    Drug Rash A drug rash is a change in the color or texture of the skin that is caused by a drug. It can develop minutes, hours, or days after the person takes the drug. CAUSES This condition is usually caused by a drug allergy. It can also be caused by exposure to sunlight after taking a drug that makes the skin sensitive to light. Drugs that commonly cause rashes include:  Penicillin.  Antibiotic medicines.  Medicines that treat seizures.  Medicines that treat cancer (chemotherapy).  Aspirin and other nonsteroidal anti-inflammatory drugs (NSAIDs).  Injectable dyes that contain iodine.  Insulin. SYMPTOMS Symptoms of this condition include:  Redness.  Tiny bumps.  Peeling.  Itching.  Itchy welts (hives).  Swelling. The rash may appear on a small area of skin or all over the body. DIAGNOSIS To diagnose the condition, your health care provider will do a physical exam. He or she may also order tests to find out which drug caused the rash. Tests to find the cause of a rash include:  Skin tests.  Blood tests.  Drug challenge. For this test, you stop taking all of the drugs that you do not need to take, and then you start taking them again by adding back one of the drugs at a time. TREATMENT A drug rash may be treated with medicines, including:  Antihistamines. These may be given to relieve itching.  An NSAID. This may be given to reduce swelling and treat pain.  A steroid drug. This may be given to reduce swelling. The rash usually goes away when the person stops taking the drug that caused it. HOME CARE INSTRUCTIONS  Take medicines only as directed by your health care provider.  Let all of your health care providers know about any drug reactions you have had in the past.  If you have hives, take a  cool shower or use a cool compress to relieve itchiness. SEEK MEDICAL CARE IF:  You have a fever.  Your rash is not going away.  Your rash gets worse.  Your rash comes back.  You have wheezing or coughing. SEEK IMMEDIATE MEDICAL CARE IF:  You start to have breathing problems.  You start to have shortness of breath.  You face or throat starts to swell.  You have severe weakness with dizziness or fainting.  You have chest pain.   This information is not intended to replace advice given to you by your health care provider. Make sure you discuss any questions you have with your health care provider.   Document Released: 06/09/2004 Document Revised: 05/23/2014 Document Reviewed: 02/26/2014 Elsevier Interactive Patient Education 2016 [employer].  Allergy Shots Allergy shots, also called immunotherapy, are a treatment used to help reduce allergy symptoms such as:  Sneezing.  Itchy, watery eyes.  A runny, stuffy nose.  Asthma. Allergy shots may benefit people who are allergic to:  Grass, tree, and weed pollens.  Insects.  Animal dander.  Dust mites.  Molds. HOW DO ALLERGY SHOTS WORK? Allergy shots work by exposing your body to a little bit of the substance you are allergic to (allergen) at a time. They allow your body to become familiar with the allergen and create proteins called antibodies, which block the effects of the allergen. Allergy shots begin to work shortly after you begin treatment, but allergy  symptoms may not improve for 4-6 months.  HOW OFTEN AND FOR HOW LONG WILL I NEED SHOTS? Most people start by getting shots 1-3 times a week for 3-6 months, and then continue to get maintenance shots about once a month for life. Some people can stop getting shots after 3-5 years.  Allergy shots may be discontinued if:  The shots do not work for you.  You start taking certain medicines such as ACE inhibitors or beta blockers.  You miss many appointments for  your shots.  You do not follow the instructions given to you by your health care provider. WHAT ARE THE SIDE EFFECTS OF ALLERGY SHOTS? The most common side effect is mild redness and swelling where the shot was given. The redness and swelling goes away on its own. Less common side effects are:  Itchy eyes, nose, or throat.  Sneezing.  Runny nose.  Itchy, red, swollen areas of skin (hives).  Trouble breathing.  Coughing.  Wheezing.  Scratchy throat.  Tightness in the chest.  Nausea.  Dizziness. After getting an allergy shot, you will need to stay at the clinic for up to 30 minutes so that a health care provider can be sure you do not have serious side effects.   This information is not intended to replace advice given to you by your health care provider. Make sure you discuss any questions you have with your health care provider.   Document Released: 02/09/2008 Document Revised: 05/23/2014 Document Reviewed: 02/11/2014 Elsevier Interactive Patient Education 2016 [employer]  Allergies An allergy is an abnormal reaction to a substance by the body's defense system (immune system). Allergies can develop at any age. WHAT CAUSES ALLERGIES? An allergic reaction happens when the immune system mistakenly reacts to a normally harmless substance, called an allergen, as if it were harmful. The immune system releases antibodies to fight the substance. Antibodies eventually release a chemical called histamine into the bloodstream. The release of histamine is meant to protect the body from infection, but it also causes discomfort. An allergic reaction can be triggered by:  Eating an allergen.  Inhaling an allergen.  Touching an allergen. WHAT TYPES OF ALLERGIES ARE THERE? There are many types of allergies. Common types include:  Seasonal allergies. People with this type of allergy are usually allergic to substances that are only present during certain seasons, such as molds and  pollens.  Food allergies.  Drug allergies.  Insect allergies.  Animal dander allergies. WHAT ARE SYMPTOMS OF ALLERGIES? Possible allergy symptoms include:  Swelling of the lips, face, tongue, mouth, or throat.  Sneezing, coughing, or wheezing.  Nasal congestion.  Tingling in the mouth.  Rash.  Itching.  Itchy, red, swollen areas of skin (hives).  Watery eyes.  Vomiting.  Diarrhea.  Dizziness.  Lightheadedness.  Fainting.  Trouble breathing or swallowing.  Chest tightness.  Rapid heartbeat. HOW ARE ALLERGIES DIAGNOSED? Allergies are diagnosed with a medical and family history and one or more of the following:  Skin tests.  Blood tests.  A food diary. A food diary is a record of all the foods and drinks you have in a day and of all the symptoms you experience.  The results of an elimination diet. An elimination diet involves eliminating foods from your diet and then adding them back in one by one to find out if a certain food causes an allergic reaction. HOW ARE ALLERGIES TREATED? There is no cure for allergies, but allergic reactions can be treated with medicine. Severe  reactions usually need to be treated at a hospital. HOW CAN REACTIONS BE PREVENTED? The best way to prevent an allergic reaction is by avoiding the substance you are allergic to. Allergy shots and medicines can also help prevent reactions in some cases. People with severe allergic reactions may be able to prevent a life-threatening reaction called anaphylaxis with a medicine given right after exposure to the allergen.   This information is not intended to replace advice given to you by your health care provider. Make sure you discuss any questions you have with your health care provider.   Document Released: 07/26/2002 Document Revised: 05/23/2014 Document Reviewed: 02/11/2014 Elsevier Interactive Patient Education [employer]

## 2015-10-30 NOTE — ED Provider Notes (Signed)
CSN: [id number]     Arrival date & time 10/30/15  1958 History   First MD Initiated Contact with Patient 10/30/15 2024     Chief Complaint  Patient presents with  . Rash   (Consider location/radiation/quality/duration/timing/severity/associated sxs/prior Treatment) HPI  Ralph Kim is a 75 y.o. male presenting to UC with c/o diffuse erythematous pruritic rash for 2 days following a kenalog and lidocaine injection into a Baker's cyst.  Rash moderately pruritic and is worse along his Left lower leg and across his lower abdomen.Pt was seen initially by his PCP for Left leg pain. PCP was concerned for a blood clot, U/S was negative for DVT on Monday, 10/26/15, but did show a baker's cyst.  The cyst was drained and injected on Tuesday, 6/13. He has been having intermittent rash since. He did call the provider's office, who recommended using benadryl, which he has been using, but no relief.  He has also been using topical benadryl cream and taking Lasix as prescribed. He does not believe the Lasix is source of rash as he has had it in the past w/o reaction but does know he is allergic to cortizone, so he assumes he is allergic to the kenalog. Denies oral swelling or difficulty breathing. Denies any other new soaps, lotions, medications, or foods.  Denies fever, n/v/d.    Past Medical History  Diagnosis Date  . Liver cyst   . Kidney cysts   . Hypertension   . Osteoarthritis   . Bronchitis, acute     PMH   Past Surgical History  Procedure Laterality Date  . Wisdom tooth extraction    . Cyst excision      on back  . 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 Cancer Brother    Social History  Substance Use Topics  . Smoking status: Never Smoker   . Smokeless tobacco: Never Used  . Alcohol Use: No    Review of Systems   Constitutional: Negative for fever and chills.  Respiratory: Negative for chest tightness, shortness of breath, wheezing and stridor.   Gastrointestinal: Negative for nausea and vomiting.  Musculoskeletal: Negative for myalgias and arthralgias.  Skin: Positive for color change and rash. Negative for wound.    Allergies  Cortizone-10 and Celebrex  Home Medications   Prior to Admission medications   Medication Sig Start Date End Date Taking? Authorizing Provider  baclofen (LIORESAL) 10 MG tablet Take 1 tablet (10 mg total) by mouth every 8 (eight) hours as needed for muscle spasms. 03/30/15   Marshia Ly, PA-C  carvedilol (COREG) 12.5 MG tablet Take 1 tablet (12.5 mg total) by mouth 2 (two) times daily with a meal. 02/17/15   Monica Becton, MD  furosemide (LASIX) 40 MG tablet Take 1 tablet (40 mg total) by mouth daily. 10/20/15   Monica Becton, MD  hydrOXYzine (ATARAX/VISTARIL) 25 MG tablet Take 1 tablet (25 mg total) by mouth every 8 (eight) hours as needed for itching. 10/30/15   Junius Finner, PA-C  oxyCODONE (OXYCONTIN) 20 mg 12 hr tablet Take 1 tablet (20 mg total) by mouth every 12 (twelve) hours. 04/01/15   Marshia Ly, PA-C  oxyCODONE-acetaminophen (PERCOCET/ROXICET) 5-325 MG tablet Take 1-2 tablets by mouth every 4 (four) hours as needed for severe pain. 03/30/15   Marshia Ly, PA-C  TURMERIC PO Take 1 capsule by mouth daily.    Historical Provider, MD   Meds Ordered and Administered this Visit  Medications - No data to display  BP 171/84 mmHg  Pulse 64  Temp(Src) 97.6 F (36.4 C) (Oral)  Resp 16  Ht 5\' 9"  (1.753 m)  Wt 198 lb (89.812 kg)  BMI 29.23 kg/m2  SpO2 98% No data found.   Physical Exam  Constitutional: He is oriented to person, place, and time. He appears well-developed and well-nourished.  HENT:  Head: Normocephalic and atraumatic.  Mouth/Throat: Oropharynx is clear and moist.  No oral swelling  Eyes: EOM are normal.  Neck: Normal  range of motion.  Cardiovascular: Normal rate, regular rhythm and normal heart sounds.   Pulses:      Dorsalis pedis pulses are 2+ on the left side.  Pulmonary/Chest: Effort normal. No respiratory distress. He has no wheezes.  Musculoskeletal: Normal range of motion. He exhibits no edema or tenderness.  Left lower leg: full ROM, no edema or tenderness. Calf is soft.   Neurological: He is alert and oriented to person, place, and time.  Left lower leg: normal sensation with light touch  Skin: Skin is warm and dry. Rash noted. There is erythema.  Faint erythematous papular rash across lower abdomen, along waist band as well as patchy erythematous papular rash on Left lower leg.  Left lower leg- erythematous with mild ecchymosis in lower leg (chronic per pt)  Psychiatric: He has a normal mood and affect. His behavior is normal.  Nursing note and vitals reviewed.   ED Course  Procedures (including critical care time)  Labs Review Labs Reviewed - No data to display  Imaging Review No results found.   MDM   1. Rash and nonspecific skin eruption    Pt concerned for allergic reaction to Kenalog injection about 3 days ago.  No respiratory distress or oral swelling.   Hesitant to start pt on prednisone taper or give another steroid shot for suspected allergic reaction as pt unsure what he is allergic to as he reports allergy to Cortizone but now appears to be allergic to Kenalog.  Will try a different antihistamine- Atarax (advised pt this may cause drowsiness, do not take with benadyrl, alcohol or drive while taking)   Strongly advised to go to emergency department if rash worsens or he develops new symptoms including oral swelling or difficulty breathing. F/u with PCP next week.   Junius FinnerErin O'Malley, PA-C 10/31/15 [phone]

## 2015-11-02 ENCOUNTER — Telehealth: Payer: Self-pay | Admitting: Emergency Medicine

## 2015-11-02 MED ORDER — HYDROXYZINE HCL 25 MG PO TABS: 25.0000 mg | ORAL_TABLET | Freq: Three times a day (TID) | ORAL | Status: DC | PRN

## 2015-11-02 NOTE — ED Notes (Signed)
Called patient advised [facility].

## 2015-11-10 ENCOUNTER — Ambulatory Visit: Payer: Medicare Other | Admitting: Family Medicine

## 2015-11-24 ENCOUNTER — Encounter: Payer: Self-pay | Admitting: Sports Medicine

## 2015-11-24 ENCOUNTER — Ambulatory Visit (INDEPENDENT_AMBULATORY_CARE_PROVIDER_SITE_OTHER): Payer: Medicare Other | Admitting: Sports Medicine

## 2015-11-24 VITALS — BP 177/77 | HR 80 | Resp 18 | Wt 201.1 lb

## 2015-11-24 DIAGNOSIS — M7989 Other specified soft tissue disorders: Secondary | ICD-10-CM | POA: Diagnosis not present

## 2015-11-24 NOTE — Progress Notes (Deleted)
  Subjective:    CC: Follow-up  HPI: Left knee pain and swelling: Ultrasound showed a Baker's cyst in the left popliteal fossa, he is post left total knee arthroplasty, did extremely well with almost complete resolution of pain, swelling is also improved. He does wear compression hose occasionally.  Past medical history, Surgical history, Family history not pertinant except as noted below, Social history, Allergies, and medications have been entered into the medical record, reviewed, and no changes needed.   Review of Systems: No fevers, chills, night sweats, weight loss, chest pain, or shortness of breath.   Objective:    General: Well Developed, well nourished, and in no acute distress.  Neuro: Alert and oriented x3, extra-ocular muscles intact, sensation grossly intact.  HEENT: Normocephalic, atraumatic, pupils equal round reactive to light, neck supple, no masses, no lymphadenopathy, thyroid nonpalpable.  Skin: Warm and dry, no rashes. Cardiac: Regular rate and rhythm, no murmurs rubs or gallops, no lower extremity edema.  Respiratory: Clear to auscultation bilaterally. Not using accessory muscles, speaking in full sentences. Left knee: No longer tender, no longer swollen, still with some lower extremity swelling below the knee. Negative Homans sign.  Impression and Recommendations:

## 2015-11-24 NOTE — Progress Notes (Signed)
Patient ID: Ralph Kim, male   DOB: 1940/07/28, 75 y.o.   MRN: [id number]  Subjective:    CC: follow-up Baker's cyst   HPI: 75 yo here for follow-up from L popliteal Baker's cyst one month ago, which was aspirated and injected at that time.  He said he originally "broke out in a rash" from the aspiration injection but since then has been feeling well and has no complaints of pain or swelling.  No new or current issues.   Past medical history, Surgical history, Family history not pertinant except as noted below, Social history, Allergies, and medications have been entered into the medical record, reviewed, and no changes needed.   Review of Systems: No fevers, chills, night sweats, weight loss, chest pain, or shortness of breath.   Objective:    General: Well Developed, well nourished, and in no acute distress.  Neuro: Alert and oriented x3, extra-ocular muscles intact, sensation grossly intact.  HEENT: Normocephalic, atraumatic, pupils equal round reactive to light, neck supple, no masses, no lymphadenopathy, thyroid nonpalpable.  Skin: Warm and dry, no rashes. Cardiac: Regular rate and rhythm, no murmurs rubs or gallops, no lower extremity edema.  Respiratory: Clear to auscultation bilaterally. Not using accessory muscles, speaking in full sentences. Left Knee: Normal to inspection with no erythema or effusion or obvious bony abnormalities. Palpation normal with no warmth, joint line tenderness, patellar tenderness, or condyle tenderness. ROM full in flexion and extension and lower leg rotation.  Impression and Recommendations:   Baker's cyst appears to have completely resolved.  Encouraged use of compression stockings and continuing PT exercises.

## 2015-11-24 NOTE — Assessment & Plan Note (Signed)
Doing well after aspiration and injection of Baker's cyst, return as needed, advised to wear compression hose frequently.

## 2016-06-13 ENCOUNTER — Telehealth: Payer: Self-pay | Admitting: Family Medicine

## 2016-06-13 NOTE — Telephone Encounter (Signed)
Pt has been seen here for Ortho only with Dr. Karie Schwalbe and now wants to establish with a primary care doctor. I let him know that Lesly Rubensteinjade, [facility] were accepting new pt but he would prefer to see a male provider. Would you take the patient as a primary care pt? Thanks

## 2016-06-14 NOTE — Telephone Encounter (Signed)
Sorry I am full as Dr T.

## 2016-06-15 ENCOUNTER — Ambulatory Visit (INDEPENDENT_AMBULATORY_CARE_PROVIDER_SITE_OTHER): Payer: Medicare Other | Admitting: Sports Medicine

## 2016-06-15 ENCOUNTER — Encounter: Payer: Self-pay | Admitting: Physician Assistant

## 2016-06-15 ENCOUNTER — Ambulatory Visit (INDEPENDENT_AMBULATORY_CARE_PROVIDER_SITE_OTHER): Payer: Medicare Other | Admitting: Physician Assistant

## 2016-06-15 VITALS — BP 182/111 | HR 73 | Wt 209.0 lb

## 2016-06-15 DIAGNOSIS — I10 Essential (primary) hypertension: Secondary | ICD-10-CM

## 2016-06-15 DIAGNOSIS — Z96652 Presence of left artificial knee joint: Secondary | ICD-10-CM | POA: Diagnosis not present

## 2016-06-15 DIAGNOSIS — R351 Nocturia: Secondary | ICD-10-CM

## 2016-06-15 DIAGNOSIS — R252 Cramp and spasm: Secondary | ICD-10-CM

## 2016-06-15 DIAGNOSIS — Z0000 Encounter for general adult medical examination without abnormal findings: Secondary | ICD-10-CM | POA: Diagnosis not present

## 2016-06-15 LAB — CBC
HEMATOCRIT: 44.5 % (ref 38.5–50.0)
HEMOGLOBIN: 15.2 g/dL (ref 13.2–17.1)
MCH: 31.2 pg (ref 27.0–33.0)
MCHC: 34.2 g/dL (ref 32.0–36.0)
MCV: 91.4 fL (ref 80.0–100.0)
MPV: 11 fL (ref 7.5–12.5)
PLATELETS: 220 10*3/uL (ref 140–400)
RBC: 4.87 MIL/uL (ref 4.20–5.80)
RDW: 13.8 % (ref 11.0–15.0)
WBC: 6.1 10*3/uL (ref 3.8–10.8)

## 2016-06-15 LAB — LIPID PANEL
CHOL/HDL RATIO: 4.5 ratio (ref <5.0)
CHOLESTEROL: 153 mg/dL (ref <200)
HDL: 34 mg/dL — ABNORMAL LOW (ref >40)
LDL Cholesterol: 93 mg/dL (ref <100)
TRIGLYCERIDES: 129 mg/dL (ref <150)
VLDL: 26 mg/dL (ref <30)

## 2016-06-15 LAB — HEMOGLOBIN A1C
Hgb A1c MFr Bld: 5.3 % (ref <5.7)
Mean Plasma Glucose: 105 mg/dL

## 2016-06-15 MED ORDER — LISINOPRIL-HYDROCHLOROTHIAZIDE 20-25 MG PO TABS: 1.0000 | ORAL_TABLET | Freq: Every day | ORAL | 1 refills | Status: DC

## 2016-06-15 NOTE — Progress Notes (Signed)
HPI:                                                                Ralph Kim is a 76 y.o. male who presents to [facility]: Primary Care Sports Medicine today to establish care   Current Concerns include left knee pain, nocturia  HTN: not currently taking medication. He was prescribed Carvedilol 12.5 mg BID, but ran out of refills. Patient reports he also had elevated blood pressures while in the hospital for his total knee arthroplasty that delayed his surgery. Denies vision change, headache, chest pain, dyspnea, lightheadedness. He does have intermittent peripheral edema, left greater than right.  Nocturia: patient reports getting up to urinate 2-3 times nightly. He also reports having to urinate again less than 2 hours after urinating. AUASS score 9.  Patient reports that he is very active. He enjoys hunting, yard work, and Publishing copy. He does not require any assistance with ADL's.  Health Maintenance Health Maintenance  Topic Date Due  . TETANUS/TDAP  07/13/1959  . COLONOSCOPY  07/12/1990  . ZOSTAVAX  07/12/2000  . PNA vac Low Risk Adult (1 of 2 - PCV13) 07/12/2005  . INFLUENZA VACCINE  12/15/2015     Health Habits  Diet: fair  Caffeine: 5 cups coffee per day  Exercise: PT for knee, 45 min daily  ETOH: no  Tobacco: no  Drugs: no  Past Medical History:  Diagnosis Date  . Bronchitis, acute    PMH  . Hypertension   . Kidney cysts   . Liver cyst   . Osteoarthritis    Past Surgical History:  Procedure Laterality Date  . CYST EXCISION     on back  . 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 WISDOM TOOTH EXTRACTION     Social History  Substance Use Topics  . Smoking status: Never Smoker  . Smokeless tobacco: Never Used  . Alcohol use No   family history includes Cancer in his brother.  Review of Systems   Constitutional: Negative for chills, diaphoresis, fever, malaise/fatigue and weight loss.  HENT: Positive for hearing loss.        Tinnitus  Respiratory: Negative for cough, shortness of breath and wheezing.   Cardiovascular: Positive for leg swelling. Negative for chest pain, palpitations and claudication.  Gastrointestinal: Negative.   Genitourinary: Positive for frequency. Negative for hematuria.       Nocturia  Musculoskeletal: Positive for joint pain (left knee, left foot).  Neurological: Negative.   Psychiatric/Behavioral: Negative.     Medications: Current Outpatient Prescriptions  Medication Sig Dispense Refill  . TURMERIC PO Take 1 capsule by mouth daily.    Marland Kitchen lisinopril-hydrochlorothiazide (PRINZIDE,ZESTORETIC) 20-25 MG tablet Take 1 tablet by mouth daily. 90 tablet 1   No current facility-administered medications for this visit.    Allergies  Allergen Reactions  . Cortizone-10 [Hydrocortisone] Hives and Swelling  . Celebrex [Celecoxib] Rash       Objective:  BP (!) 191/116   Pulse 73   Wt 209 lb (94.8 kg)   BMI 30.86 kg/m  Gen: well-groomed, cooperative, not ill-appearing, no distress HEENT: normal conjunctiva, TM's clear, oropharynx  clear, moist mucus membranes, no thyromegaly or tenderness Pulm: Normal work of breathing, clear to auscultation bilaterally CV: Normal rate, regular rhythm, s1 and s2 distinct, no murmurs, clicks or rubs appreciated on this exam, no carotid bruit GI: soft, nondistended, nontender, no masses Neuro: alert and oriented x 3, EOM's intact, PERRLA MSK: well-healed longitudinal surgical scar on left knee, pain on lateral joint line of left knee, full active ROM, pulses are not palpable but visible on doppler Skin: warm and dry, no rashes or lesions on exposed skin, no ulcers or wounds on feet Psych: normal affect, pleasant mood, normal speech and thought content   No results found for this or any previous visit (from the past 72  hour(s)). No results found.    Assessment and Plan: 76 y.o. male with   1. Essential hypertension BP Readings from Last 3 Encounters:  06/15/16 (!) 182/111  11/24/15 (!) 177/77  10/30/15 171/84   - BP very out of range today. Patient asymptomatic. Patient has a history of poor compliance with medication. We discussed at length the risks of uncontrolled hypertension and he is amenable to taking daily medication.  - check pressures at home - close follow-up in 1 week - lisinopril-hydrochlorothiazide (PRINZIDE,ZESTORETIC) 20-25 MG tablet; Take 1 tablet by mouth daily.  Dispense: 90 tablet; Refill: 1  Nocturia more than twice per night - PSA, total and free  Encounter for preventative adult health care examination - CBC - CMP w/GFR - Hemoglobin A1c - Lipid panel - Cologuard ordered - will give Prevnar at 1 week follow-up - deferring Shingles until Shingrix is in stock - negative PHQ-2. He does become sentimental and glassy eyed talking about friends and family, but this seems appropriate at this stage of life. Reassuring that he is maintaining his hobbies and relationships.  Patient education and anticipatory guidance given Patient agrees with treatment plan Follow-up in 1 week for BP check and Pneumonia vaccine or sooner as needed  Levonne Hubertharley E. Cummings PA-C

## 2016-06-15 NOTE — Assessment & Plan Note (Signed)
Is post left total knee arthroplasty, now with increasing pain over the lateral joint line and lateral patellar facet. No evidence of a Baker's cyst or effusion on ultrasound. Checking an x-ray as well as a bone scan looking for loosening of the prosthesis. He also does have a follow-up coming up with Dr. Luiz BlareGraves.

## 2016-06-15 NOTE — Progress Notes (Signed)
  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 Significant swelling. He has had several lower extreme Dopplers all negative for DVTs, pain is moderate, persistent. We did drain a Baker cyst in the past which does not appear to be present today. He does wear his compression hose often which seemed to help.  Past medical history:  Negative.  See flowsheet/record as well for more information.  Surgical history: Negative.  See flowsheet/record as well for more information.  Family history: Negative.  See flowsheet/record as well for more information.  Social history: Negative.  See flowsheet/record as well for more information.  Allergies, and medications have been entered into the medical record, reviewed, and no changes needed.   Review of Systems: No fevers, chills, night sweats, weight loss, chest pain, or shortness of breath.   Objective:    General: Well Developed, well nourished, and in no acute distress.  Neuro: Alert and oriented x3, extra-ocular muscles intact, sensation grossly intact.  HEENT: Normocephalic, atraumatic, pupils equal round reactive to light, neck supple, no masses, no lymphadenopathy, thyroid nonpalpable.  Skin: Warm and dry, no rashes. Cardiac: Regular rate and rhythm, no murmurs rubs or gallops, no lower extremity edema.  Respiratory: Clear to auscultation bilaterally. Not using accessory muscles, speaking in full sentences. Left Knee: Well-healed arthroplasty scar, minimal if any tenderness over the lateral joint line, more tenderness over the medial joint line and medial proximal tibia. There is also significant tenderness to the calf ROM normal in flexion and extension and lower leg rotation. Ligaments with solid consistent endpoints including ACL,  PCL, LCL, MCL. Negative Mcmurray's and provocative meniscal tests. Non painful patellar compression. Patellar and quadriceps tendons unremarkable. Hamstring and quadriceps strength is normal. I'm unable to palpate his dorsalis pedis or posterior tibial pulses  Procedure: Diagnostic Ultrasound of left lower extremity Device: GE Logiq E  Findings: Noted visible but small dorsalis pedis and posterior tibial pulses. Thick intima. Images permanently stored and available for review in the ultrasound unit.  Impression: Visible posterior tibial and dorsalis pedis pulses, with likely peripheral arterial disease.  Impression and Recommendations:    History of arthroplasty of left knee Is post left total knee arthroplasty, now with increasing pain over the lateral joint line and lateral patellar facet. No evidence of a Baker's cyst or effusion on ultrasound. Checking an x-ray as well as a bone scan looking for loosening of the prosthesis. He also does have a follow-up coming up with Dr. Luiz BlareGraves.  Cramp in lower leg Nonpalpable dorsalis pedis and posterior tibial pulses, I am able to visualize them on ultrasound. We are going to get an official ABI. He wears his compression hose daily which seemed to help, but has persistent pain in the left lower leg. Sometimes with walking, sometimes at rest.  I spent 25 minutes with this patient, greater than 50% was face-to-face time counseling regarding the above diagnoses, this was separate from the time spent performing the above procedure.

## 2016-06-15 NOTE — Assessment & Plan Note (Signed)
Nonpalpable dorsalis pedis and posterior tibial pulses, I am able to visualize them on ultrasound. We are going to get an official ABI. He wears his compression hose daily which seemed to help, but has persistent pain in the left lower leg. Sometimes with walking, sometimes at rest.

## 2016-06-15 NOTE — Patient Instructions (Addendum)
Start Lisinopril-HCTZ - 1 pill daily Restrict salt and follow DASH eating plan  DASH Eating Plan DASH stands for "Dietary Approaches to Stop Hypertension." The DASH eating plan is a healthy eating plan that has been shown to reduce high blood pressure (hypertension). Additional health benefits may include reducing the risk of type 2 diabetes mellitus, heart disease, and stroke. The DASH eating plan may also help with weight loss. What do I need to know about the DASH eating plan? For the DASH eating plan, you will follow these general guidelines:  Choose foods with less than 150 milligrams of sodium per serving (as listed on the food label).  Use salt-free seasonings or herbs instead of table salt or sea salt.  Check with your health care provider or pharmacist before using salt substitutes.  Eat lower-sodium products. These are often labeled as "low-sodium" or "no salt added."  Eat fresh foods. Avoid eating a lot of canned foods.  Eat more vegetables, fruits, and low-fat dairy products.  Choose whole grains. Look for the word "whole" as the first word in the ingredient list.  Choose fish and skinless chicken or [place] more often than red meat. Limit fish, poultry, and meat to 6 oz (170 g) each day.  Limit sweets, desserts, sugars, and sugary drinks.  Choose heart-healthy fats.  Eat more home-cooked food and less restaurant, buffet, and fast food.  Limit fried foods.  Do not fry foods. Cook foods using methods such as baking, boiling, grilling, and broiling instead.  When eating at a restaurant, ask that your food be prepared with less salt, or no salt if possible. What foods can I eat? Seek help from a dietitian for individual calorie needs. Grains  Whole grain or whole wheat bread. Brown rice. Whole grain or whole wheat pasta. Quinoa, bulgur, and whole grain cereals. Low-sodium cereals. Corn or whole wheat flour tortillas. Whole grain cornbread. Whole grain crackers.  Low-sodium crackers. Vegetables  Fresh or frozen vegetables (raw, steamed, roasted, or grilled). Low-sodium or reduced-sodium tomato and vegetable juices. Low-sodium or reduced-sodium tomato sauce and paste. Low-sodium or reduced-sodium canned vegetables. Fruits  All fresh, canned (in natural juice), or frozen fruits. Meat and Other Protein Products  Ground beef (85% or leaner), grass-fed beef, or beef trimmed of fat. Skinless chicken or [place]key. Ground chicken or [place]key. Pork trimmed of fat. All fish and seafood. Eggs. Dried beans, peas, or lentils. Unsalted nuts and seeds. Unsalted canned beans. Dairy  Low-fat dairy products, such as skim or 1% milk, 2% or reduced-fat cheeses, low-fat ricotta or cottage cheese, or plain low-fat yogurt. Low-sodium or reduced-sodium cheeses. Fats and Oils  Tub margarines without trans fats. Light or reduced-fat mayonnaise and salad dressings (reduced sodium). Avocado. Safflower, olive, or canola oils. Natural peanut or almond butter. Other  Unsalted popcorn and pretzels. The items listed above may not be a complete list of recommended foods or beverages. Contact your dietitian for more options.  What foods are not recommended? Grains  White bread. White pasta. White rice. Refined cornbread. Bagels and croissants. Crackers that contain trans fat. Vegetables  Creamed or fried vegetables. Vegetables in a cheese sauce. Regular canned vegetables. Regular canned tomato sauce and paste. Regular tomato and vegetable juices. Fruits  Canned fruit in light or heavy syrup. Fruit juice. Meat and Other Protein Products  Fatty cuts of meat. Ribs, chicken wings, bacon, sausage, bologna, salami, chitterlings, fatback, hot dogs, bratwurst, and packaged luncheon meats. Salted nuts and seeds. Canned beans with salt. Dairy  Whole  or 2% milk, cream, half-and-half, and cream cheese. Whole-fat or sweetened yogurt. Full-fat cheeses or blue cheese. Nondairy creamers and whipped  toppings. Processed cheese, cheese spreads, or cheese curds. Condiments  Onion and garlic salt, seasoned salt, table salt, and sea salt. Canned and packaged gravies. Worcestershire sauce. Tartar sauce. Barbecue sauce. Teriyaki sauce. Soy sauce, including reduced sodium. Steak sauce. Fish sauce. Oyster sauce. Cocktail sauce. Horseradish. Ketchup and mustard. Meat flavorings and tenderizers. Bouillon cubes. Hot sauce. Tabasco sauce. Marinades. Taco seasonings. Relishes. Fats and Oils  Butter, stick margarine, lard, shortening, ghee, and bacon fat. Coconut, palm kernel, or palm oils. Regular salad dressings. Other  Pickles and olives. Salted popcorn and pretzels. The items listed above may not be a complete list of foods and beverages to avoid. Contact your dietitian for more information.  Where can I find more information? National Heart, Lung, and Blood Institute: [employer] This information is not intended to replace advice given to you by your health care provider. Make sure you discuss any questions you have with your health care provider. Document Released: 04/21/2011 Document Revised: 10/08/2015 Document Reviewed: 03/06/2013 Elsevier Interactive Patient Education  2017 [employer].

## 2016-06-16 DIAGNOSIS — I10 Essential (primary) hypertension: Secondary | ICD-10-CM | POA: Insufficient documentation

## 2016-06-16 DIAGNOSIS — R351 Nocturia: Secondary | ICD-10-CM | POA: Insufficient documentation

## 2016-06-16 LAB — COMPLETE METABOLIC PANEL WITH GFR
ALT: 12 U/L (ref 9–46)
AST: 18 U/L (ref 10–35)
Albumin: 4.1 g/dL (ref 3.6–5.1)
Alkaline Phosphatase: 65 U/L (ref 40–115)
BILIRUBIN TOTAL: 0.5 mg/dL (ref 0.2–1.2)
BUN: 11 mg/dL (ref 7–25)
CHLORIDE: 106 mmol/L (ref 98–110)
CO2: 26 mmol/L (ref 20–31)
CREATININE: 0.97 mg/dL (ref 0.70–1.18)
Calcium: 9.4 mg/dL (ref 8.6–10.3)
GFR, Est African American: 88 mL/min (ref >=60)
GFR, Est Non African American: 76 mL/min (ref >=60)
GLUCOSE: 102 mg/dL — AB (ref 65–99)
Potassium: 4.7 mmol/L (ref 3.5–5.3)
SODIUM: 141 mmol/L (ref 135–146)
TOTAL PROTEIN: 7 g/dL (ref 6.1–8.1)

## 2016-06-16 LAB — PSA, TOTAL AND FREE
PSA, % FREE: 50 % (ref >25)
PSA, Free: 0.6 ng/mL
PSA, Total: 1.2 ng/mL (ref <=4.0)

## 2016-06-22 ENCOUNTER — Ambulatory Visit (INDEPENDENT_AMBULATORY_CARE_PROVIDER_SITE_OTHER): Payer: Medicare Other | Admitting: Physician Assistant

## 2016-06-22 VITALS — BP 138/73 | HR 79 | Ht 67.25 in | Wt 212.1 lb

## 2016-06-22 DIAGNOSIS — I10 Essential (primary) hypertension: Secondary | ICD-10-CM

## 2016-06-22 NOTE — Progress Notes (Signed)
   Subjective:    Patient ID: Ralph McGeeMickie Hillier, male    DOB: 31-Dec-1940, 76 y.o.   MRN: [id number]  HPI Pt is here for BP check. Pt denies chest pain, lightheadedness, dizziness, headaches, or shortness of breath.   Review of Systems  Respiratory: Negative for shortness of breath.   Cardiovascular: Negative for chest pain.  Neurological: Negative for dizziness, light-headedness and headaches.       Objective:   Physical Exam Vitals:   06/22/16 1511  BP: 138/73  Pulse: 79          Assessment & Plan:  Pt declined PCV13 vaccine Pt advised to follow-up with PCP in 3 months.

## 2016-06-28 ENCOUNTER — Other Ambulatory Visit: Payer: Self-pay | Admitting: Sports Medicine

## 2016-06-28 DIAGNOSIS — R0989 Other specified symptoms and signs involving the circulatory and respiratory systems: Secondary | ICD-10-CM

## 2016-06-28 DIAGNOSIS — I739 Peripheral vascular disease, unspecified: Secondary | ICD-10-CM

## 2016-07-01 ENCOUNTER — Inpatient Hospital Stay (HOSPITAL_COMMUNITY): Admission: RE | Admit: 2016-07-01 | Payer: Medicare Other | Source: Ambulatory Visit

## 2016-07-02 LAB — COLOGUARD

## 2016-07-05 ENCOUNTER — Ambulatory Visit (INDEPENDENT_AMBULATORY_CARE_PROVIDER_SITE_OTHER): Payer: Medicare Other | Admitting: Physician Assistant

## 2016-07-05 VITALS — BP 140/84 | HR 93 | Wt 209.0 lb

## 2016-07-05 DIAGNOSIS — I10 Essential (primary) hypertension: Secondary | ICD-10-CM | POA: Diagnosis not present

## 2016-07-05 DIAGNOSIS — K4090 Unilateral inguinal hernia, without obstruction or gangrene, not specified as recurrent: Secondary | ICD-10-CM

## 2016-07-05 MED ORDER — LISINOPRIL-HYDROCHLOROTHIAZIDE 20-12.5 MG PO TABS: 1.0000 | ORAL_TABLET | Freq: Every day | ORAL | 0 refills | Status: DC

## 2016-07-05 NOTE — Patient Instructions (Addendum)
Inguinal Hernia, Adult Introduction An inguinal hernia is when fat or the intestines push through the area where the leg meets the lower abdomen (groin) and create a rounded lump (bulge). This condition develops over time. There are three types of inguinal hernias. These types include:  Hernias that can be pushed back into the belly (are reducible).  Hernias that are not reducible (are incarcerated).  Hernias that are not reducible and lose their blood supply (are strangulated). This type of hernia requires emergency surgery. What are the causes? This condition is caused by having a weak spot in the muscles or tissue. This weakness lets the hernia poke through. This condition can be triggered by:  Suddenly straining the muscles of the lower abdomen.  Lifting heavy objects.  Straining to have a bowel movement. Difficult bowel movements (constipation) can lead to this.  Coughing. What increases the risk? This condition is more likely to develop in:  Men.  Pregnant women.  People who:  Are overweight.  Work in jobs that require long periods of standing or heavy lifting.  Have had an inguinal hernia before.  Smoke or have lung disease. These factors can lead to long-lasting (chronic) coughing. What are the signs or symptoms? Symptoms can depend on the size of the hernia. Often, a small inguinal hernia has no symptoms. Symptoms of a larger hernia include:  A lump in the groin. This is easier to see when the person is standing. It might not be visible when he or she is lying down.  Pain or burning in the groin. This occurs especially when lifting, straining, or coughing.  A dull ache or a feeling of pressure in the groin.  A lump in the scrotum in men. Symptoms of a strangulated inguinal hernia can include:  A bulge in the groin that is very painful and tender to the touch.  A bulge that turns red or purple.  Fever, nausea, and vomiting.  The inability to have a bowel  movement or to pass gas. How is this diagnosed? This condition is diagnosed with a medical history and physical exam. Your health care provider may feel your groin area and ask you to cough. How is this treated? Treatment for this condition varies depending on the size of your hernia and whether you have symptoms. If you do not have symptoms, your health care provider may have you watch your hernia carefully and come in for follow-up visits. If your hernia is larger or if you have symptoms, your treatment will include surgery. Follow these instructions at home: Lifestyle  Drink enough fluid to keep your urine clear or pale yellow.  Eat a diet that includes a lot of fiber. Eat plenty of fruits, vegetables, and whole grains. Talk with your health care provider if you have questions.  Avoid lifting heavy objects.  Avoid standing for long periods of time.  Do not use tobacco products, including cigarettes, chewing tobacco, or e-cigarettes. If you need help quitting, ask your health care provider.  Maintain a healthy weight. General instructions  Do not try to force the hernia back in.  Watch your hernia for any changes in color or size. Let your health care provider know if any changes occur.  Take over-the-counter and prescription medicines only as told by your health care provider.  Keep all follow-up visits as told by your health care provider. This is important. Contact a health care provider if:  You have a fever.  You have new symptoms.  Your symptoms  get worse. Get help right away if:  You have pain in the groin that suddenly gets worse.  A bulge in the groin gets bigger suddenly and does not go down.  You are a man and you have a sudden pain in the scrotum, or the size of your scrotum suddenly changes.  A bulge in the groin area becomes red or purple and is painful to the touch.  You have nausea or vomiting that does not go away.  You feel your heart beating a lot  more quickly than normal.  You cannot have a bowel movement or pass gas. This information is not intended to replace advice given to you by your health care provider. Make sure you discuss any questions you have with your health care provider. Document Released: 09/18/2008 Document Revised: 10/08/2015 Document Reviewed: 03/12/2014  2017 Elsevier   Orthostatic Hypotension Orthostatic hypotension is a sudden drop in blood pressure that happens when you quickly change positions, such as when you get up from a seated or lying position. Blood pressure is a measurement of how strongly, or weakly, your blood is pressing against the walls of your arteries. Arteries are blood vessels that carry blood from your heart throughout your body. When blood pressure is too low, you may not get enough blood to your brain or to the rest of your organs. This can cause weakness, light-headedness, rapid heartbeat, and fainting. This can last for just a few seconds or for up to a few minutes. Orthostatic hypotension is usually not a serious problem. However, if it happens frequently or gets worse, it may be a sign of something more serious. What are the causes? This condition may be caused by:  Sudden changes in posture, such as standing up quickly after you have been sitting or lying down.  Blood loss.  Loss of body fluids (dehydration).  Heart problems.  Hormone (endocrine) problems.  Pregnancy.  Severe infection.  Lack of certain nutrients.  Severe allergic reactions (anaphylaxis).  Certain medicines, such as blood pressure medicine or medicines that make the body lose excess fluids (diuretics). Sometimes, this condition can be caused by not taking medicine as directed, such as taking too much of a certain medicine. What increases the risk? Certain factors can make you more likely to develop orthostatic hypotension, including:  Age. Risk increases as you get older.  Conditions that affect the heart  or the central nervous system.  Taking certain medicines, such as blood pressure medicine or diuretics.  Being pregnant. What are the signs or symptoms? Symptoms of this condition may include:  Weakness.  Light-headedness.  Dizziness.  Blurred vision.  Fatigue.  Rapid heartbeat.  Fainting, in severe cases. How is this diagnosed? This condition is diagnosed based on:  Your medical history.  Your symptoms.  Your blood pressure measurement. Your health care provider will check your blood pressure when you are:  Lying down.  Sitting.  Standing. A blood pressure reading is recorded as two numbers, such as "120 over 80" (or 120/80). The first ("top") number is called the systolic pressure. It is a measure of the pressure in your arteries as your heart beats. The second ("bottom") number is called the diastolic pressure. It is a measure of the pressure in your arteries when your heart relaxes between beats. Blood pressure is measured in a unit called mm Hg. Healthy blood pressure for adults is 120/80. If your blood pressure is below 90/60, you may be diagnosed with hypotension. Other information or tests  that may be used to diagnose orthostatic hypotension include:  Your other vital signs, such as your heart rate and temperature.  Blood tests.  Tilt table test. For this test, you will be safely secured to a table that moves you from a lying position to an upright position. Your heart rhythm and blood pressure will be monitored during the test. How is this treated? Treatment for this condition may include:  Changing your diet. This may involve eating more salt (sodium) or drinking more water.  Taking medicines to raise your blood pressure.  Changing the dosage of certain medicines you are taking that might be lowering your blood pressure.  Wearing compression stockings. These stockings help to prevent blood clots and reduce swelling in your legs. In some cases, you may  need to go to the hospital for:  Fluid replacement. This means you will receive fluids through an IV tube.  Blood replacement. This means you will receive donated blood through an IV tube (transfusion).  Treating an infection or heart problems, if this applies.  Monitoring. You may need to be monitored while medicines that you are taking wear off. Follow these instructions at home: Eating and drinking  Drink enough fluid to keep your urine clear or pale yellow.  Eat a healthy diet and follow instructions from your health care provider about eating or drinking restrictions. A healthy diet includes:  Fresh fruits and vegetables.  Whole grains.  Lean meats.  Low-fat dairy products.  Eat extra salt only as directed. Do not add extra salt to your diet unless your health care provider told you to do that.  Eat frequent, small meals.  Avoid standing up suddenly after eating. Medicines  Take over-the-counter and prescription medicines only as told by your health care provider.  Follow instructions from your health care provider about changing the dosage of your current medicines, if this applies.  Do not stop or adjust any of your medicines on your own. General instructions  Wear compression stockings as told by your health care provider.  Get up slowly from lying down or sitting positions. This gives your blood pressure a chance to adjust.  Avoid hot showers and excessive heat as directed by your health care provider.  Return to your normal activities as told by your health care provider. Ask your health care provider what activities are safe for you.  Do not use any products that contain nicotine or tobacco, such as cigarettes and e-cigarettes. If you need help quitting, ask your health care provider.  Keep all follow-up visits as told by your health care provider. This is important. Contact a health care provider if:  You vomit.  You have diarrhea.  You have a fever  for more than 2-3 days.  You feel more thirsty than usual.  You feel weak and tired. Get help right away if:  You have chest pain.  You have a fast or irregular heartbeat.  You develop numbness in any part of your body.  You cannot move your arms or your legs.  You have trouble speaking.  You become sweaty or feel lightheaded.  You faint.  You feel short of breath.  You have trouble staying awake.  You feel confused. This information is not intended to replace advice given to you by your health care provider. Make sure you discuss any questions you have with your health care provider. Document Released: 04/22/2002 Document Revised: 01/19/2016 Document Reviewed: 10/23/2015 Elsevier Interactive Patient Education  2017 [employer].

## 2016-07-05 NOTE — Progress Notes (Signed)
HPI:                                                                Mickie HillierGlenn Bronkema is a 76 y.o. male [facility] today for groin pain  Right-sided groin pain x 8 weeks. He thinks it may be a pulled muscle or a hernia. Pain is worse with straining and coughing. States he can occasionally feel a bulge. Describes pain as "sore." He has not tried any OTC pain relievers for this. He has no history of hernias. He denies fever, chills, weight loss, or abdominal pain.  HTN: taking Lisinopril-HCTZ. Checks BP's at home. BP range 120's/70's. Denies vision change, headache, chest pain, dyspnea, and edema. He does endorse periodic episodes of lightheadedness with position change. Patient states this did occur before starting blood pressure medicine. He noticed in church last week after sitting for close to an hour and standing to exit the church he became lightheaded for a few seconds. He did not lose his balance or syncopize. Denies associated vision change, diaphoresis, palpitations or nausea with these episodes. They self-resolve.   Past Medical History:  Diagnosis Date  . Bronchitis, acute    PMH  . Hypertension   . Kidney cysts   . Liver cyst   . Osteoarthritis    Past Surgical History:  Procedure Laterality Date  . CYST EXCISION     on back  . TOTAL KNEE ARTHROPLASTY Left 03/30/2015   Procedure: TOTAL KNEE ARTHROPLASTY;  Surgeon: Jodi GeraldsJohn Graves, MD;  Location: [facility] OR;  Service: Orthopedics;  Laterality: Left;  . WISDOM TOOTH EXTRACTION     Social History  Substance Use Topics  . Smoking status: Never Smoker  . Smokeless tobacco: Never Used  . Alcohol use No   family history includes Cancer in his brother.  ROS: negative except as noted in the HPI  Medications: Current Outpatient Prescriptions  Medication Sig Dispense Refill  . lisinopril-hydrochlorothiazide (PRINZIDE,ZESTORETIC) 20-25 MG tablet Take 1 tablet by mouth daily.  90 tablet 1  . TURMERIC PO Take 1 capsule by mouth daily.     No current facility-administered medications for this visit.    Allergies  Allergen Reactions  . Cortizone-10 [Hydrocortisone] Hives and Swelling  . Celebrex [Celecoxib] Rash       Objective:  BP 140/84   Pulse 93   Wt 209 lb (94.8 kg)   BMI 32.49 kg/m  Gen: well-groomed, cooperative, not ill-appearing, no distress Pulm: Normal work of breathing, normal phonation, clear to auscultation bilaterally, no wheezes, rales or rhonchi CV: Normal rate, regular rhythm, s1 and s2 distinct, no murmurs, clicks or rubs  GI: soft, nondistended, nontender GU: right scrotum nontender without swelling, testicle normal, nontender epididymis, there is a slight bulge in the right inguinal canal with valsava, left scrotum and testicle normal, no left-sided hernia  Neuro: alert and oriented x 3, EOM's intact Skin: warm and dry, no rashes or lesions on exposed skin, no cyanosis   No results found for this or any previous visit (from the past 72 hour(s)). No results found.    Assessment and Plan: 76 y.o. male with  1. Right inguinal hernia - Ambulatory referral to General Surgery  2. Essential hypertension, goal <140/90 -  BP in range today - decreasing hctz to 12.5mg  to prevent further orthostasis - provided DASH eating plan - lisinopril-hydrochlorothiazide (PRINZIDE,ZESTORETIC) 20-12.5 MG tablet; Take 1 tablet by mouth daily.  Dispense: 90 tablet; Refill: 0  Patient education and anticipatory guidance given Patient agrees with treatment plan Follow-up as needed if symptoms worsen or fail to improve  Levonne Hubert PA-C

## 2016-07-08 ENCOUNTER — Telehealth: Payer: Self-pay

## 2016-07-08 NOTE — Telephone Encounter (Signed)
-----   Message from [facility] Elizabeth Cummings, [place]-C sent at 07/07/2016  3:56 PM EST ----- Cologuard was negative. Recommend repeat Cologuard in 3 years.

## 2016-07-08 NOTE — Telephone Encounter (Signed)
Pt.notified

## 2016-07-14 ENCOUNTER — Ambulatory Visit (HOSPITAL_COMMUNITY)
Admission: RE | Admit: 2016-07-14 | Discharge: 2016-07-14 | Disposition: A | Discharge status: Home / Self Care | Payer: Medicare Other | Source: Ambulatory Visit | Attending: Sports Medicine | Admitting: Sports Medicine

## 2016-07-14 DIAGNOSIS — I10 Essential (primary) hypertension: Secondary | ICD-10-CM | POA: Diagnosis not present

## 2016-07-14 DIAGNOSIS — I739 Peripheral vascular disease, unspecified: Secondary | ICD-10-CM

## 2016-07-14 DIAGNOSIS — R0989 Other specified symptoms and signs involving the circulatory and respiratory systems: Secondary | ICD-10-CM | POA: Diagnosis not present

## 2016-07-18 ENCOUNTER — Other Ambulatory Visit: Payer: Self-pay | Admitting: *Deleted

## 2016-07-18 DIAGNOSIS — Z96652 Presence of left artificial knee joint: Secondary | ICD-10-CM

## 2016-07-27 ENCOUNTER — Ambulatory Visit (INDEPENDENT_AMBULATORY_CARE_PROVIDER_SITE_OTHER): Payer: Medicare Other

## 2016-07-27 DIAGNOSIS — Z96652 Presence of left artificial knee joint: Secondary | ICD-10-CM | POA: Diagnosis not present

## 2016-07-27 DIAGNOSIS — T8484XA Pain due to internal orthopedic prosthetic devices, implants and grafts, initial encounter: Secondary | ICD-10-CM | POA: Diagnosis not present

## 2016-07-27 DIAGNOSIS — Y838 Other surgical procedures as the cause of abnormal reaction of the patient, or of later complication, without mention of misadventure at the time of the procedure: Secondary | ICD-10-CM

## 2016-08-05 ENCOUNTER — Encounter (HOSPITAL_COMMUNITY)
Admission: RE | Admit: 2016-08-05 | Discharge: 2016-08-05 | Disposition: A | Discharge status: Home / Self Care | Payer: Medicare Other | Source: Ambulatory Visit | Attending: Sports Medicine | Admitting: Sports Medicine

## 2016-08-05 DIAGNOSIS — Z96652 Presence of left artificial knee joint: Secondary | ICD-10-CM | POA: Insufficient documentation

## 2016-08-05 MED ORDER — TECHNETIUM TC 99M MEDRONATE IV KIT
20.5000 | PACK | Freq: Once | INTRAVENOUS | Status: AC | PRN
  Administered 2016-08-05: 20.5 via INTRAVENOUS

## 2016-08-26 NOTE — Addendum Note (Signed)
Encounter addended by: Ulla Potash on: 08/26/2016  3:09 PM<BR>    Actions taken: Imaging Exam ended, Charge Capture section accepted

## 2016-09-08 ENCOUNTER — Ambulatory Visit: Payer: Self-pay | Admitting: Podiatry

## 2016-09-19 ENCOUNTER — Ambulatory Visit: Payer: Medicare Other | Admitting: Physician Assistant

## 2016-09-27 ENCOUNTER — Ambulatory Visit (INDEPENDENT_AMBULATORY_CARE_PROVIDER_SITE_OTHER): Payer: Medicare Other | Admitting: Physician Assistant

## 2016-09-27 VITALS — BP 134/87 | HR 90 | Wt 195.0 lb

## 2016-09-27 DIAGNOSIS — I10 Essential (primary) hypertension: Secondary | ICD-10-CM

## 2016-09-27 DIAGNOSIS — M79672 Pain in left foot: Secondary | ICD-10-CM | POA: Insufficient documentation

## 2016-09-27 DIAGNOSIS — M25562 Pain in left knee: Secondary | ICD-10-CM | POA: Diagnosis not present

## 2016-09-27 DIAGNOSIS — Z96652 Presence of left artificial knee joint: Secondary | ICD-10-CM

## 2016-09-27 DIAGNOSIS — G8929 Other chronic pain: Secondary | ICD-10-CM | POA: Diagnosis not present

## 2016-09-27 DIAGNOSIS — L84 Corns and callosities: Secondary | ICD-10-CM | POA: Diagnosis not present

## 2016-09-27 NOTE — Assessment & Plan Note (Signed)
Tender to palpation over the left first MTP plantar callus as well as over the sesamoids. He will take his home hemp oil per his request for now. I would like him to come back for custom molded orthotics with a dancers pad.

## 2016-09-27 NOTE — Progress Notes (Signed)
HPI:                                                                Ralph Kim is a 76 y.o. male who presents to [facility]: Primary Care Sports Medicine today for hypertension follow-up and foot pain  HTN: taking Lisinopril-HCTZ daily. Compliant with medications. Checks BP's at home. BP range 120's/70's. Patient has been doing well since reducing dose of HCTZ. Denies further episodes of lightheadedness/orthostasis. Denies vision change, headache, chest pain with exertion, orthopnea, lightheadedness, syncope and edema.   Patient reports worsening left forefoot pain over the last few months. He denies any trauma or known injury to his foot. He states he has had a callus present on the bottom of his left foot for years that he occasionally "grinds down" and has used a razor blade to shave. Patient states he has tried Dr. Margart Sickles inserts, which relieves some of the pressure, but he still has significant pain with ambulation. He has also been using CBD oil, which he thinks helps a little. He was seen by a podiatrist for this last week and was offered surgery, but patient would like to avoid a surgical procedure if possible.  Patient's left knee continues to bother him. He has followed with Sports Medicine and had negative work-up to include a bone scan, X rays, and [place]. It was recommended he follow-up with his surgeon who performed his arthroplasty, but he has not done this yet. Patient states he has tried Diclofenac gel and Pennsaid in the past.  Past Medical History:  Diagnosis Date  . Bronchitis, acute    PMH  . Hypertension   . Kidney cysts   . Liver cyst   . Osteoarthritis    Past Surgical History:  Procedure Laterality Date  . CYST EXCISION     on back  . 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 WISDOM TOOTH EXTRACTION     Social History   Substance Use Topics  . Smoking status: Never Smoker  . Smokeless tobacco: Never Used  . Alcohol use No   family history includes Cancer in his brother.  ROS: negative except as noted in the HPI  Medications: Current Outpatient Prescriptions  Medication Sig Dispense Refill  . lisinopril-hydrochlorothiazide (PRINZIDE,ZESTORETIC) 20-12.5 MG tablet Take 1 tablet by mouth daily. 90 tablet 0  . TURMERIC PO Take 1 capsule by mouth daily.     No current facility-administered medications for this visit.    Allergies  Allergen Reactions  . Cortizone-10 [Hydrocortisone] Hives and Swelling  . Celebrex [Celecoxib] Rash       Objective:  BP 134/87   Pulse 90   Wt 195 lb (88.5 kg)   BMI 30.31 kg/m  Gen: well-groomed, cooperative, not ill-appearing, no distress Pulm: Normal work of breathing, normal phonation, clear to auscultation bilaterally CV: Normal rate, regular rhythm, s1 and s2 distinct, no murmurs, clicks or rubs  Neuro: alert and oriented x 3, EOM's intact MSK: left foot atraumatic, tenderness of the plantar aspect of the left 1st MTP joint, moving all extremities, normal gait and station, no peripheral edema  Skin: Hyperkeratotic skin over the left  first metatarsal phalangeal joint Psych: good eye contact, normal affect, euthymic mood, normal speech and thought content    No results found for this or any previous visit (from the past 72 hour(s)). No results found.    Assessment and Plan: 76 y.o. male with   1. Plantar callus - follow-up with Sports Medicine for orthotics/offloading  2. Essential hypertension with goal blood pressure less than 140/90 - cont daily medications - limit salt. DASH eating plan - follow-up in 6 months  3. 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 Cummings PA-C

## 2016-09-27 NOTE — Progress Notes (Signed)
   Subjective:    I'm seeing this patient as a consultation for:  Gena Frayharley Cummings, PA-C  CC: Left foot pain  HPI: For months this pleasant 76 year old male has had pain on the plantar aspect of his first MTP, he has been to a podiatrist who recommended surgery of some kind, he has is some old orthotics that he has not been wearing. Pain is moderate, persistent, localized without radiation. He doesn't desire any oral analgesics or NSAIDs, he is using hemp oil that he tells me is controlling his pain well.  Past medical history:  Negative.  See flowsheet/record as well for more information.  Surgical history: Negative.  See flowsheet/record as well for more information.  Family history: Negative.  See flowsheet/record as well for more information.  Social history: Negative.  See flowsheet/record as well for more information.  Allergies, and medications have been entered into the medical record, reviewed, and no changes needed.   Review of Systems: No headache, visual changes, nausea, vomiting, diarrhea, constipation, dizziness, abdominal pain, skin rash, fevers, chills, night sweats, weight loss, swollen lymph nodes, body aches, joint swelling, muscle aches, chest pain, shortness of breath, mood changes, visual or auditory hallucinations.   Objective:   General: Well Developed, well nourished, and in no acute distress.  Neuro/Psych: Alert and oriented x3, extra-ocular muscles intact, able to move all 4 extremities, sensation grossly intact. Skin: Warm and dry, no rashes noted.  Respiratory: Not using accessory muscles, speaking in full sentences, trachea midline.  Cardiovascular: Pulses palpable, no extremity edema. Abdomen: Does not appear distended. Left Foot: No visible erythema or swelling. Range of motion is full in all directions. Strength is 5/5 in all directions. No hallux valgus. No pes cavus or pes planus. Tender to palpation at the plantar first MTP over the sesamoids as well  as over the callus on the skin itself. No pain over the navicular prominence, or base of fifth metatarsal. No tenderness to palpation of the calcaneal insertion of plantar fascia. No pain at the Achilles insertion. No pain over the calcaneal bursa. No pain of the retrocalcaneal bursa. No tenderness to palpation over the tarsals, metatarsals, or phalanges. No hallux rigidus or limitus. No tenderness palpation over interphalangeal joints. No pain with compression of the metatarsal heads. Neurovascularly intact distally.  Impression and Recommendations:   This case required medical decision making of moderate complexity.  Plantar callus Tender to palpation over the left first MTP plantar callus as well as over the sesamoids. He will take his home hemp oil per his request for now. I would like him to come back for custom molded orthotics with a dancers pad.

## 2016-09-29 ENCOUNTER — Encounter: Payer: Self-pay | Admitting: Sports Medicine

## 2016-09-29 ENCOUNTER — Ambulatory Visit (INDEPENDENT_AMBULATORY_CARE_PROVIDER_SITE_OTHER): Payer: Medicare Other | Admitting: Sports Medicine

## 2016-09-29 DIAGNOSIS — L84 Corns and callosities: Secondary | ICD-10-CM

## 2016-09-29 NOTE — Progress Notes (Signed)
    Patient was fitted for a : standard, cushioned, semi-rigid orthotic. The orthotic was heated and afterward the patient stood on the orthotic blank positioned on the orthotic stand. The patient was positioned in subtalar neutral position and 10 degrees of ankle dorsiflexion in a weight bearing stance. After completion of molding, a stable base was applied to the orthotic blank. The blank was ground to a stable position for weight bearing. Size: 11 Base: [facility] Additional Posting and Padding: Bilateral dancers pad The patient ambulated these, and they were very comfortable.  I spent 40 minutes with this patient, greater than 50% was face-to-face time counseling regarding the below diagnosis.

## 2016-09-29 NOTE — Assessment & Plan Note (Signed)
To palpation at the left first MTP plantar callus as well as over the sesamoid. Continue hemp oil, custom orthotics with dancers pad as above. Return in one month.

## 2016-09-30 ENCOUNTER — Encounter: Payer: Self-pay | Admitting: Physician Assistant

## 2016-09-30 DIAGNOSIS — G8929 Other chronic pain: Secondary | ICD-10-CM | POA: Insufficient documentation

## 2016-09-30 DIAGNOSIS — Z96652 Presence of left artificial knee joint: Secondary | ICD-10-CM

## 2016-09-30 DIAGNOSIS — M25562 Pain in left knee: Secondary | ICD-10-CM

## 2016-10-28 ENCOUNTER — Ambulatory Visit (INDEPENDENT_AMBULATORY_CARE_PROVIDER_SITE_OTHER): Payer: Medicare Other | Admitting: Sports Medicine

## 2016-10-28 ENCOUNTER — Encounter: Payer: Self-pay | Admitting: Sports Medicine

## 2016-10-28 DIAGNOSIS — M79672 Pain in left foot: Secondary | ICD-10-CM

## 2016-10-28 NOTE — Assessment & Plan Note (Signed)
Severe and multifactorial, painful over the plantar callus, but also over the sesamoids. At this point he has failed orthotics, unloading, greater than 6 weeks of physician directed conservative measures so we are going to proceed with left foot MRI, I would also like him to wear a boot in the meantime.

## 2016-10-28 NOTE — Progress Notes (Signed)
  Subjective:    CC: Follow-up  HPI: Left foot pain: Localized on the plantar aspect of the first MTP. We've tried orthotics, unloading with a dancers pad, nothing has helped.  Past medical history:  Negative.  See flowsheet/record as well for more information.  Surgical history: Negative.  See flowsheet/record as well for more information.  Family history: Negative.  See flowsheet/record as well for more information.  Social history: Negative.  See flowsheet/record as well for more information.  Allergies, and medications have been entered into the medical record, reviewed, and no changes needed.   Review of Systems: No fevers, chills, night sweats, weight loss, chest pain, or shortness of breath.   Objective:    General: Well Developed, well nourished, and in no acute distress.  Neuro: Alert and oriented x3, extra-ocular muscles intact, sensation grossly intact.  HEENT: Normocephalic, atraumatic, pupils equal round reactive to light, neck supple, no masses, no lymphadenopathy, thyroid nonpalpable.  Skin: Warm and dry, no rashes. Cardiac: Regular rate and rhythm, no murmurs rubs or gallops, no lower extremity edema.  Respiratory: Clear to auscultation bilaterally. Not using accessory muscles, speaking in full sentences. Left Foot: No visible erythema or swelling. Range of motion is full in all directions. Strength is 5/5 in all directions. No hallux valgus. No pes cavus or pes planus. Abnormal callus under the first MTP, severe tenderness over the callus, and with the calluses pushed aside he also has significant tenderness over the medial sesamoid bone. No pain over the navicular prominence, or base of fifth metatarsal. No tenderness to palpation of the calcaneal insertion of plantar fascia. No pain at the Achilles insertion. No pain over the calcaneal bursa. No pain of the retrocalcaneal bursa. No tenderness to palpation over the tarsals, metatarsals, or phalanges. No hallux  rigidus or limitus. No tenderness palpation over interphalangeal joints. No pain with compression of the metatarsal heads. Neurovascularly intact distally.  Impression and Recommendations:    Left foot pain Severe and multifactorial, painful over the plantar callus, but also over the sesamoids. At this point he has failed orthotics, unloading, greater than 6 weeks of physician directed conservative measures so we are going to proceed with left foot MRI, I would also like him to wear a boot in the meantime.  I spent 25 minutes with this patient, greater than 50% was face-to-face time counseling regarding the above diagnoses

## 2016-11-07 ENCOUNTER — Ambulatory Visit (INDEPENDENT_AMBULATORY_CARE_PROVIDER_SITE_OTHER): Payer: Medicare Other

## 2016-11-07 DIAGNOSIS — M19072 Primary osteoarthritis, left ankle and foot: Secondary | ICD-10-CM

## 2016-11-17 ENCOUNTER — Telehealth: Payer: Self-pay | Admitting: Physician Assistant

## 2016-11-17 NOTE — Telephone Encounter (Signed)
Pt called and states he is waiting to hear back from Dr.T about what he needs to do for his foot. He wants to know if he needs to see a Careers advisersurgeon or Dr.T.  Patient states he called last week and spoke with someone and they said they would call him back.?

## 2016-11-17 NOTE — Telephone Encounter (Signed)
We have already told him what to do, multiple times, on June 27 and June 28.

## 2016-11-28 ENCOUNTER — Ambulatory Visit (INDEPENDENT_AMBULATORY_CARE_PROVIDER_SITE_OTHER): Payer: Medicare Other | Admitting: Podiatry

## 2016-11-28 ENCOUNTER — Encounter: Payer: Self-pay | Admitting: Podiatry

## 2016-11-28 VITALS — BP 181/88 | HR 76 | Ht 69.0 in | Wt 200.0 lb

## 2016-11-28 DIAGNOSIS — L720 Epidermal cyst: Secondary | ICD-10-CM | POA: Diagnosis not present

## 2016-11-28 DIAGNOSIS — B351 Tinea unguium: Secondary | ICD-10-CM | POA: Diagnosis not present

## 2016-11-28 DIAGNOSIS — L851 Acquired keratosis [keratoderma] palmaris et plantaris: Secondary | ICD-10-CM

## 2016-11-28 DIAGNOSIS — M79672 Pain in left foot: Secondary | ICD-10-CM | POA: Diagnosis not present

## 2016-11-28 DIAGNOSIS — M79671 Pain in right foot: Secondary | ICD-10-CM | POA: Diagnosis not present

## 2016-11-28 NOTE — Progress Notes (Signed)
SUBJECTIVE: 76 y.o. year old male presents complaining of pain under the left first metatarsophalangeal joint area with callus build up. Stated that he was treated with orthotics and he had to modify the left side to relieve pressure which helped some but not completely relieved pain. He is having pain daily and constantly more with weight bearing and some days it hurts even at night.  He was referred by Dr. Benjamin Stainhekkekandam.   Hx of left knee surgery a year and a half ago with lingering stiffness over distal lateral to the left knee joint. REVIEW OF SYSTEMS: Pertinent items noted in HPI and remainder of comprehensive ROS otherwise negative.  OBJECTIVE: DERMATOLOGIC EXAMINATION: Nails: Thick yellow dystrophic nails x 10. Porokeratotic lesion under the first MPJ at about the level of Fibular sesamoid bone left foot.  Calluses: Broad callus under the first and 5th MPJ right foot.   VASCULAR EXAMINATION OF LOWER LIMBS: All pedal pulses are palpable with normal pulsation.  Capillary Filling times within 3 seconds in all digits.  Mild ankle edema left. Temperature gradient from tibial crest to dorsum of foot is within normal bilateral.  NEUROLOGIC EXAMINATION OF THE LOWER LIMBS: Achilles DTR is present and within normal. All epicritic and tactile sensations grossly intact. Sharp and Dull discriminatory sensations at the plantar ball of hallux is intact bilateral.   MUSCULOSKELETAL EXAMINATION: No gross deformities noted.  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 Possible foreign body induced inclusion cyst. Onychomycosis x 10. Painful feet.  PLAN: Reviewed clinical findings and available treatment options, palliation, possible surgical. All nails and keratotic lesions debrided. Aperture pad placed under the left first MPJ area. Home care instruction given with supply. Return in one month for palliation.

## 2016-11-28 NOTE — Patient Instructions (Signed)
Seen for pain under the left foot. Noted of porokeratosis under the first Metatarsophalangeal joint. Possible foreign body intrusion to soft tissue. Debrided all calluses and nails, and left foot padded. Replace pad as needed with supply dispensed. Return in one month.

## 2016-12-26 ENCOUNTER — Ambulatory Visit (INDEPENDENT_AMBULATORY_CARE_PROVIDER_SITE_OTHER): Payer: Medicare Other | Admitting: Podiatry

## 2016-12-26 ENCOUNTER — Encounter: Payer: Self-pay | Admitting: Podiatry

## 2016-12-26 DIAGNOSIS — L851 Acquired keratosis [keratoderma] palmaris et plantaris: Secondary | ICD-10-CM

## 2016-12-26 DIAGNOSIS — M79671 Pain in right foot: Secondary | ICD-10-CM | POA: Diagnosis not present

## 2016-12-26 DIAGNOSIS — L720 Epidermal cyst: Secondary | ICD-10-CM

## 2016-12-26 DIAGNOSIS — M79672 Pain in left foot: Secondary | ICD-10-CM | POA: Diagnosis not present

## 2016-12-26 NOTE — Progress Notes (Signed)
SUBJECTIVE: 76 y.o. year old male presents for follow up on left foot lesion. He has had thick painful callus under the first MPJ which was debrided and padded during last visit. Stated that the foot hurts much less.Been using the pad as instructed. Able to walk with minimum discomfort since using the pads.   OBJECTIVE: DERMATOLOGIC EXAMINATION: Porokeratotic lesion under the first MPJ is much thinner with minimum pain upon weight bearing. Calluses: Broad callus under the first and 5th MPJ right foot.   VASCULAR EXAMINATION OF LOWER LIMBS: All pedal pulses are palpable with normal pulsation.  Capillary Filling times within 3 seconds in all digits.  Mild ankle edema left. Temperature gradient from tibial crest to dorsum of foot is within normal bilateral.  NEUROLOGIC EXAMINATION OF THE LOWER LIMBS: Achilles DTR is present and within normal. All epicritic and tactile sensations grossly intact. Sharp and Dull discriminatory sensations at the plantar ball of hallux is intact bilateral.   MUSCULOSKELETAL EXAMINATION: Noted of pointed sesamoid bone under the left first MPJ.  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 Painful feet.  PLAN: Debrided left foot lesion. Aperture pad dispensed x 4. Home care instruction given with supply. Return in one month for palliation.

## 2016-12-26 NOTE — Patient Instructions (Signed)
Follow up on left foot painful lesion. Callus has decreased in thickness with improved pain. Area debrided. Home care instruction with supply dispensed. Return in one month.

## 2017-01-23 ENCOUNTER — Ambulatory Visit: Payer: Medicare Other | Admitting: Podiatry

## 2017-01-30 ENCOUNTER — Ambulatory Visit (INDEPENDENT_AMBULATORY_CARE_PROVIDER_SITE_OTHER): Payer: Medicare Other | Admitting: Podiatry

## 2017-01-30 ENCOUNTER — Encounter: Payer: Self-pay | Admitting: Podiatry

## 2017-01-30 DIAGNOSIS — M79671 Pain in right foot: Secondary | ICD-10-CM

## 2017-01-30 DIAGNOSIS — B351 Tinea unguium: Secondary | ICD-10-CM | POA: Diagnosis not present

## 2017-01-30 DIAGNOSIS — M79672 Pain in left foot: Secondary | ICD-10-CM

## 2017-01-30 NOTE — Patient Instructions (Addendum)
Follow up on left foot pain. Noted of minimum callus build up. Thick dystrophic nails debrided. May benefit from compression socks for swollen left lower limb.  Return in 3 months for routine foot care or sooner if needed.

## 2017-01-30 NOTE — Progress Notes (Signed)
SUBJECTIVE: 76 y.o.year old malepresents for follow up on left foot lesion. Having problem with swollen leg on left side since the knee surgery 2 years ago.  HPI: He has had thick painful callus under the first MPJ which improved with debridement and padding.  OBJECTIVE: DERMATOLOGIC EXAMINATION: No pain under the first MPJ left foot with mild keratotic tissue build up. Thick yellow and dystrophic nails x 10. Calluses: Broad callus under the first and 5th MPJ right foot.   VASCULAR EXAMINATION OF LOWER LIMBS: All pedal pulses are palpable with normal pulsation.  Capillary Filling times within 3 seconds in all digits.  Positive of lower limb edema left. Temperature gradient from tibial crest to dorsum of foot is within normal bilateral.  NEUROLOGIC EXAMINATION OF THE LOWER LIMBS: Achilles DTR is present and within normal. All epicritic and tactile sensations grossly intact. Sharp and Dull discriminatory sensations at the plantar ball of hallux is intact bilateral.   MUSCULOSKELETAL EXAMINATION: Noted of pointed sesamoid bone under the left first MPJ.  ASSESSMENT: Resolved pain under first MPJ left foot with minimum callus build up. Thick yellow dystrophic nails x 10. Painful feet.  PLAN: Debrided plantar calluses under the first and 5th MPJ bilateral. All nails debrided.  Reviewed benefit of compression socks. Return in 3 month for palliation.

## 2017-05-01 ENCOUNTER — Ambulatory Visit: Payer: Medicare Other | Admitting: Podiatry

## 2017-05-01 DIAGNOSIS — M79671 Pain in right foot: Secondary | ICD-10-CM | POA: Diagnosis not present

## 2017-05-01 DIAGNOSIS — M79672 Pain in left foot: Secondary | ICD-10-CM | POA: Diagnosis not present

## 2017-05-01 DIAGNOSIS — B351 Tinea unguium: Secondary | ICD-10-CM | POA: Diagnosis not present

## 2017-05-01 NOTE — Patient Instructions (Signed)
Seen for hypertrophic nails and plantar callus left foot. All nails and calluses debrided. Return in 3 months or as needed.

## 2017-05-03 ENCOUNTER — Encounter: Payer: Self-pay | Admitting: Podiatry

## 2017-05-03 NOTE — Progress Notes (Signed)
Subjective: 76 y.o. year old male patient presents complaining of painful nails and follow up on plantar lesions. Still wearing shoes with accommodated shoe liner to off load first MPJ bilateral.  Objective: Dermatologic: Thick yellow deformed nails x 10. Vascular: Pedal pulses are all palpable. Orthopedic: Prominent sesamoid bone both feet with plantar skin lesion. Neurologic: All epicritic and tactile sensations grossly intact.  Assessment: Dystrophic mycotic nails x 10. Plantar callus sub 1 bilateral, mild with minimum pain.  Treatment: All mycotic nails, corns, calluses debrided.  Return in 3 months or as needed.

## 2017-07-24 ENCOUNTER — Ambulatory Visit: Payer: Medicare Other | Admitting: Podiatry

## 2018-10-07 DIAGNOSIS — N179 Acute kidney failure, unspecified: Secondary | ICD-10-CM | POA: Insufficient documentation

## 2018-10-09 ENCOUNTER — Telehealth: Payer: Self-pay | Admitting: Physician Assistant

## 2018-10-09 MED ORDER — NITROGLYCERIN 0.4 MG SL SUBL
0.40 | SUBLINGUAL_TABLET | SUBLINGUAL | Status: DC
Start: ? — End: 2018-10-09

## 2018-10-09 MED ORDER — GENERIC EXTERNAL MEDICATION
Status: DC
Start: ? — End: 2018-10-09

## 2018-10-09 MED ORDER — ACETAMINOPHEN 325 MG PO TABS
650.00 | ORAL_TABLET | ORAL | Status: DC
Start: ? — End: 2018-10-09

## 2018-10-09 MED ORDER — SODIUM CHLORIDE 0.9 % IV SOLN
10.00 | INTRAVENOUS | Status: DC
Start: ? — End: 2018-10-09

## 2018-10-09 MED ORDER — SODIUM CHLORIDE 0.9 % IV SOLN
125.00 | INTRAVENOUS | Status: DC
Start: ? — End: 2018-10-09

## 2018-10-09 MED ORDER — ENOXAPARIN SODIUM 40 MG/0.4ML ~~LOC~~ SOLN
40.00 | SUBCUTANEOUS | Status: DC
Start: 2018-10-09 — End: 2018-10-09

## 2018-10-09 MED ORDER — LOSARTAN POTASSIUM 50 MG PO TABS
50.00 | ORAL_TABLET | ORAL | Status: DC
Start: 2018-10-09 — End: 2018-10-09

## 2018-10-09 NOTE — Telephone Encounter (Signed)
Please schedule 40 min hospital follow-up for Thursday or Friday

## 2018-10-09 NOTE — Telephone Encounter (Signed)
Left VM for patient to call back to schedule.

## 2019-01-12 IMAGING — MR MR FOOT*L* W/O CM
7 series · 40 of 40 positions shown · non-contrast
Comparison: None.

CLINICAL DATA: No known injury, pain under the great toe

EXAM:
MRI OF THE LEFT FOOT WITHOUT CONTRAST
TECHNIQUE: Multiplanar, multisequence MR imaging of the left forefoot was
performed. No intravenous contrast was administered.

[Series 4: T1 · coronal · 3.0mm · 0.56mm/px · 7 of 44 slices shown (1 of 2)]
[im 1/44]
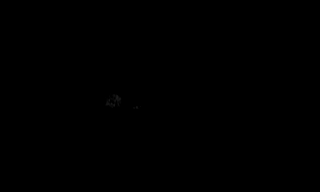
[im 8/44]
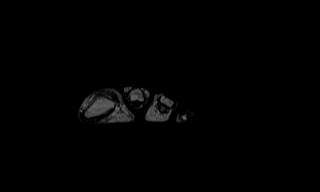
[im 15/44]
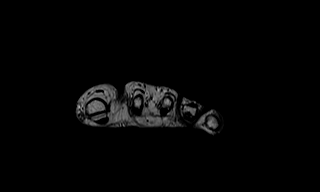
[im 22/44]
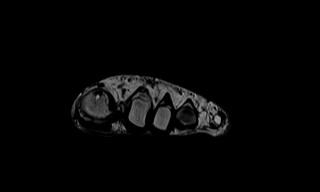
[im 29/44]
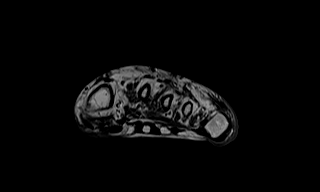
[im 36/44]
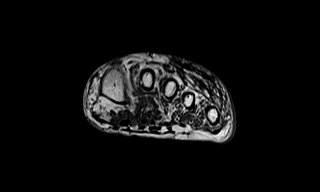
[im 44/44]
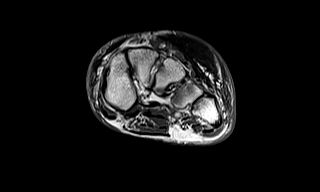

[Series 5: T2 fat-sat · coronal · 3.0mm · 0.56mm/px · 8 of 44 slices shown (1 of 3)]
[im 1/44]
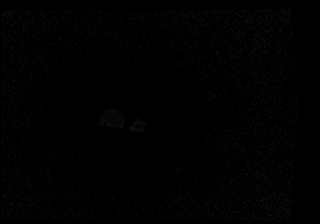
[im 7/44]
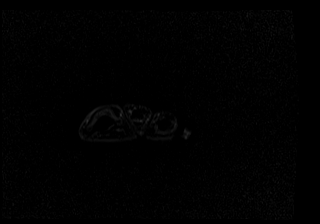
[im 13/44]
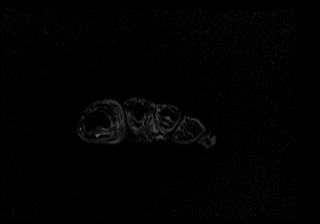
[im 19/44]
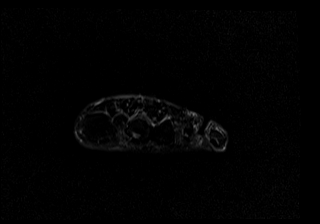
[im 25/44]
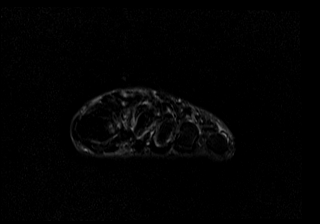
[im 31/44]
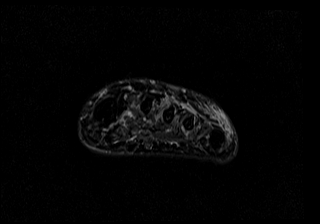
[im 37/44]
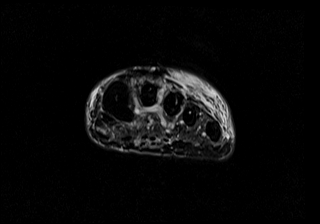
[im 44/44]
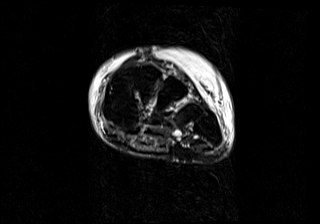

[Series 6: T1 · axial · 3.0mm · 0.53mm/px · z∈[-122,-51]mm · 4 of 23 slices shown (2 of 2)]
[im 1/23]
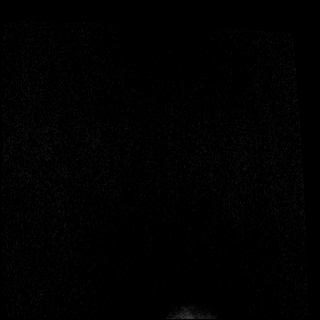
[im 8/23]
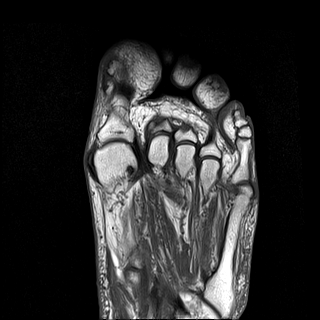
[im 15/23]
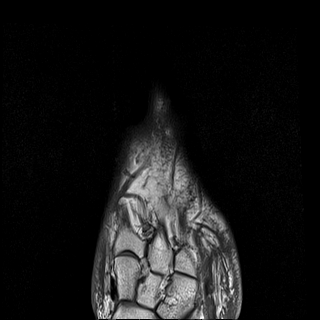
[im 23/23]
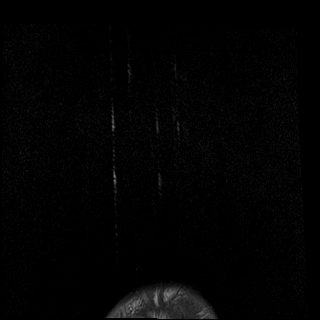

[Series 7: T2 fat-sat · axial · 3.0mm · 0.66mm/px · z∈[-122,-51]mm · 4 of 23 slices shown (2 of 3)]
[im 1/23]
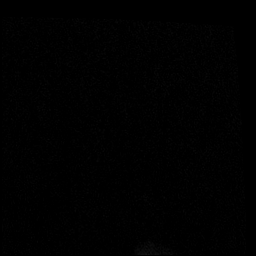
[im 8/23]
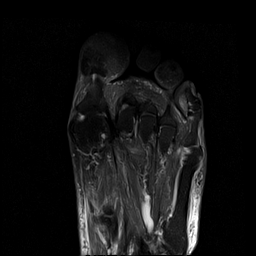
[im 15/23]
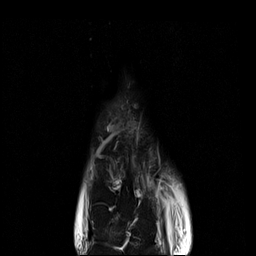
[im 23/23]
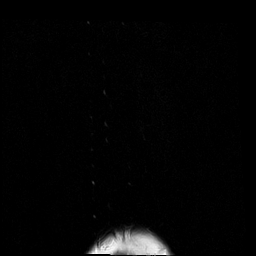

[Series 8: T2 fat-sat · sagittal · 3.0mm · 0.35mm/px · 5 of 30 slices shown (3 of 3)]
[im 1/30]
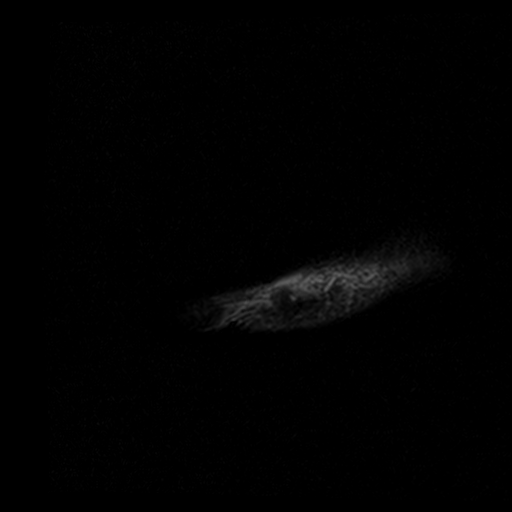
[im 8/30]
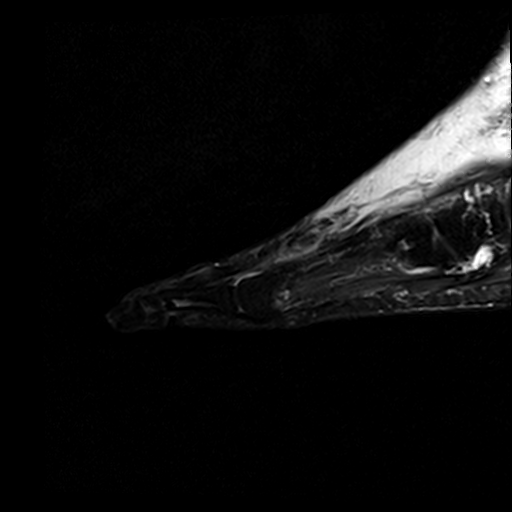
[im 15/30]
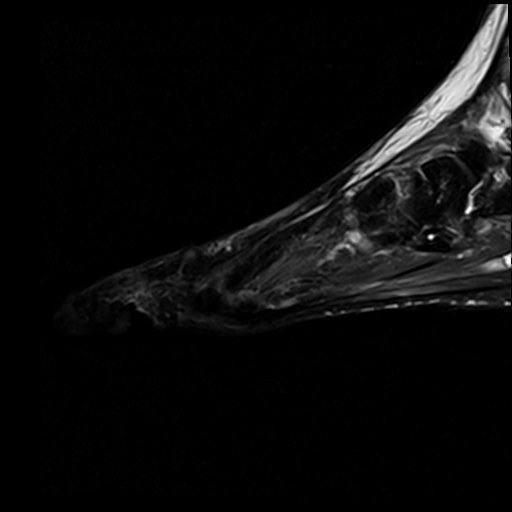
[im 22/30]
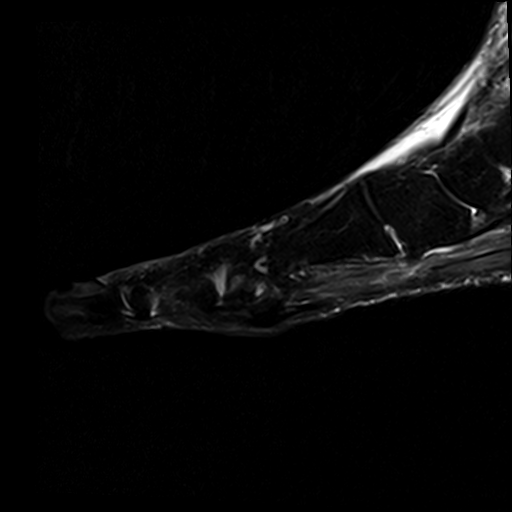
[im 30/30]
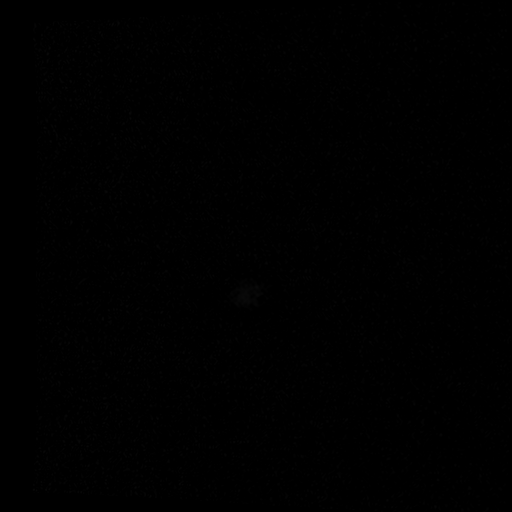

[Series 9: STIR · axial · 3.0mm · 0.66mm/px · z∈[-122,-51]mm · 4 of 23 slices shown (1 of 2)]
[im 1/23]
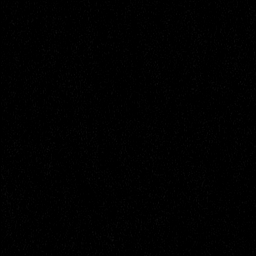
[im 8/23]
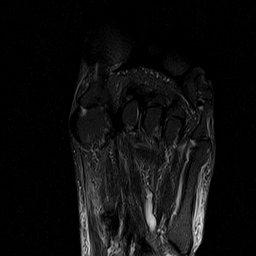
[im 15/23]
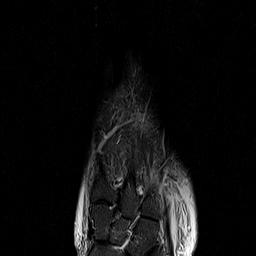
[im 23/23]
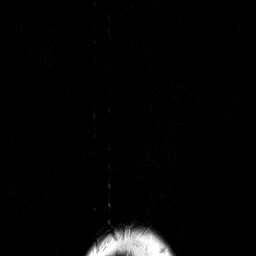

[Series 10: STIR · coronal · 3.0mm · 0.70mm/px · 8 of 44 slices shown (2 of 2)]
[im 1/44]
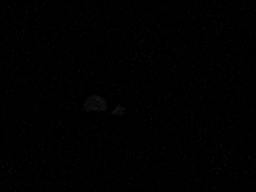
[im 7/44]
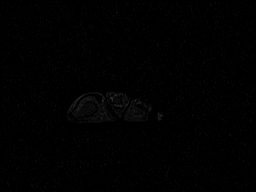
[im 13/44]
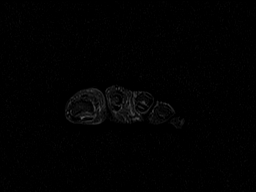
[im 19/44]
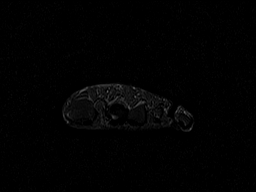
[im 25/44]
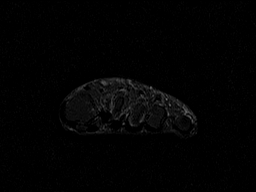
[im 31/44]
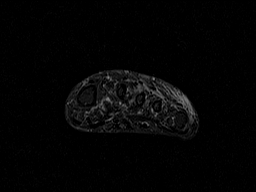
[im 37/44]
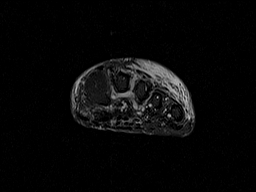
[im 44/44]
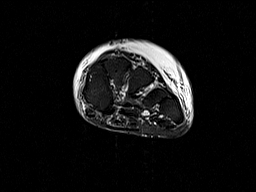

[40 of 40 positions shown; findings below may reference images not displayed]

FINDINGS: Bones/Joint/Cartilage

No marrow signal abnormality. No fracture or dislocation. Normal
alignment. No joint effusion. Mild osteoarthritis of the first MTP
joint.

Medial and lateral hallux sesamoids are normal without marrow edema
or a fracture.

Ligaments

Collateral ligaments are intact.  Lisfranc ligament is intact.

Muscles and Tendons
Flexor, peroneal and extensor compartment tendons are intact.
Muscles are normal.

Soft tissue
No fluid collection or hematoma. No soft tissue mass. Soft tissue
edema along the dorsal aspect of the mid foot extending into the
ankle.
IMPRESSION: 1. No focal abnormality of the left hallux sesamoids. No fracture or
sesamoiditis.
2. Mild osteoarthritis of the first MTP joint.

## 2020-04-14 ENCOUNTER — Other Ambulatory Visit: Payer: Self-pay

## 2020-04-14 ENCOUNTER — Ambulatory Visit (INDEPENDENT_AMBULATORY_CARE_PROVIDER_SITE_OTHER): Payer: Medicare Other | Admitting: Sports Medicine

## 2020-04-14 DIAGNOSIS — L739 Follicular disorder, unspecified: Secondary | ICD-10-CM | POA: Insufficient documentation

## 2020-04-14 DIAGNOSIS — R252 Cramp and spasm: Secondary | ICD-10-CM | POA: Diagnosis not present

## 2020-04-14 MED ORDER — DOXYCYCLINE HYCLATE 100 MG PO TABS: 100.0000 mg | ORAL_TABLET | Freq: Two times a day (BID) | ORAL | 0 refills | Status: AC

## 2020-04-14 NOTE — Assessment & Plan Note (Signed)
Ralph Kim has a tender folliculitis/may be a tiny infected sebaceous cyst on the left side of his upper gluteal cleft. Adding doxycycline, warm compresses, return as needed for this.

## 2020-04-14 NOTE — Assessment & Plan Note (Signed)
Ralph Kim continues to have achiness on the lateral aspect of his left lower leg. 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 He had a peroneal nerve decompression with Dr. Manson Passey with neurosurgery, unfortunately continues to have pain. I think this is likely radicular, we could certainly consider a lumbar epidural he did have an MRI about a year ago of his lumbar spine. He is going to think about it.

## 2020-04-14 NOTE — Progress Notes (Signed)
    Procedures performed today:    None.  Independent interpretation of notes and tests performed by another provider:   None.  Brief History, Exam, Impression, and Recommendations:    Folliculitis Ralph Kim has a tender folliculitis/may be a tiny infected sebaceous cyst on the left side of his upper gluteal cleft. Adding doxycycline, warm compresses, return as needed for this.  Cramp in lower leg Ralph Kim continues to have achiness on the lateral aspect of his left lower leg. 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 He had a peroneal nerve decompression with Dr. Manson Passey with neurosurgery, unfortunately continues to have pain. I think this is likely radicular, we could certainly consider a lumbar epidural he did have an MRI about a year ago of his lumbar spine. He is going to think about it.    ___________________________________________ Ralph Kim. Benjamin Stain, M.D., ABFM., CAQSM. Primary Care and Sports Medicine [facility]  Adjunct Instructor of Family Medicine  [employer]

## 2020-07-24 ENCOUNTER — Ambulatory Visit (INDEPENDENT_AMBULATORY_CARE_PROVIDER_SITE_OTHER): Payer: Medicare Other | Admitting: Sports Medicine

## 2020-07-24 ENCOUNTER — Other Ambulatory Visit: Payer: Self-pay

## 2020-07-24 DIAGNOSIS — L739 Follicular disorder, unspecified: Secondary | ICD-10-CM | POA: Diagnosis not present

## 2020-07-24 MED ORDER — DOXYCYCLINE HYCLATE 100 MG PO TABS: 100.0000 mg | ORAL_TABLET | Freq: Two times a day (BID) | ORAL | 0 refills | Status: AC

## 2020-07-24 NOTE — Assessment & Plan Note (Signed)
Ralph Kim returns, he is having recurrence of what appears to be a tender folliculitis or potentially a tiny infected sebaceous cyst on the left side of his upper gluteal cleft. We treated this about 4 months ago. I will treat this again with a course of doxycycline. I would like him to establish care with Dr. Ashley Royalty for further discussion.

## 2020-07-24 NOTE — Progress Notes (Signed)
    Procedures performed today:    None.  Independent interpretation of notes and tests performed by another provider:   None.  Brief History, Exam, Impression, and Recommendations:    Folliculitis Ralph Kim returns, he is having recurrence of what appears to be a tender folliculitis or potentially a tiny infected sebaceous cyst on the left side of his upper gluteal cleft. We treated this about 4 months ago. I will treat this again with a course of doxycycline. I would like him to establish care with Dr. Ashley Royalty for further discussion.    ___________________________________________ [name]. Benjamin Stain, M.D., ABFM., CAQSM. Primary Care and Sports Medicine [facility]  Adjunct Instructor of Family Medicine  [employer]

## 2020-08-24 ENCOUNTER — Other Ambulatory Visit: Payer: Self-pay

## 2020-08-24 ENCOUNTER — Encounter: Payer: Self-pay | Admitting: Family Medicine

## 2020-08-24 ENCOUNTER — Ambulatory Visit (INDEPENDENT_AMBULATORY_CARE_PROVIDER_SITE_OTHER): Payer: Medicare Other | Admitting: Family Medicine

## 2020-08-24 VITALS — BP 173/96 | HR 71 | Temp 98.2°F | Wt 206.0 lb

## 2020-08-24 DIAGNOSIS — I10 Essential (primary) hypertension: Secondary | ICD-10-CM

## 2020-08-24 DIAGNOSIS — Z13220 Encounter for screening for lipoid disorders: Secondary | ICD-10-CM

## 2020-08-24 MED ORDER — LISINOPRIL 20 MG PO TABS: 20.0000 mg | ORAL_TABLET | Freq: Every day | ORAL | 3 refills | Status: DC

## 2020-08-24 NOTE — Assessment & Plan Note (Signed)
BP elevated today.  He doesn't want to restart previous medication due to dizziness.  Will start back lisinopril 20mg  and have him follow up for nurse visit in 4 weeks for BP check.  He will have labs completed when he returns as well.

## 2020-08-24 NOTE — Progress Notes (Signed)
Ralph Kim - 80 y.o. male MRN [id number]  Date of birth: 1940-11-05  Subjective Chief Complaint  Patient presents with  . Establish Care    HPI Ralph Kim is a 80 y.o. male here today for follow visit.  This is my first visit with him.  He had seen another provider at our clinic previously.  He has a history of OA and HTN.    He is prescribed lisinopril/hctz for management of HTN.  He reports that he stopped taking this several weeks ago because he was having dizziness.  He reports BP readings at home around 145/80's.  He denies chest pain, shortness of breath, palpitations, headache, or vision changes.  ROS:  A comprehensive ROS was completed and negative except as noted per HPI   Allergies  Allergen Reactions  . Cortisone Hives, Swelling and Rash  . Gabapentin Other (See Comments)    Disoriented  . Hydrocodone Rash  . Celebrex [Celecoxib] Rash    Past Medical History:  Diagnosis Date  . Bronchitis, acute    PMH  . Hypertension   . Kidney cysts   . Liver cyst   . Obesity   . Osteoarthritis     Past Surgical History:  Procedure Laterality Date  . CYST EXCISION     on back  . 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 WISDOM TOOTH EXTRACTION      Social History   Socioeconomic History  . Marital status: Married    Spouse name: Not on file  . Number of children: Not on file  . Years of education: Not on file  . Highest education level: Not on file  Occupational History  . Not on file  Tobacco Use  . Smoking status: Never Smoker  . Smokeless tobacco: Never Used  Substance and Sexual Activity  . Alcohol use: No  . Drug use: No  . Sexual activity: Not on file  Other Topics Concern  . Not on file  Social History Narrative  . Not on file   Social Determinants of Health   Financial Resource Strain: Not on file  Food Insecurity:  Not on file  Transportation Needs: Not on file  Physical Activity: Not on file  Stress: Not on file  Social Connections: Not on file    Family History  Problem Relation Age of Onset  . Cancer Brother     Health Maintenance  Topic Date Due  . PNA vac Low Risk Adult (1 of 2 - PCV13) 08/24/2021 (Originally 07/12/2005)  . INFLUENZA VACCINE  12/14/2020  . HPV VACCINES  Aged Out  . TETANUS/TDAP  Discontinued  . COVID-19 Vaccine  Discontinued     ----------------------------------------------------------------------------------------------------------------------------------------------------------------------------------------------------------------- Physical Exam BP (!) 173/96 (BP Location: Left Arm, Patient Position: Sitting, Cuff Size: Normal)   Pulse 71   Temp 98.2 F (36.8 C)   Wt 206 lb (93.4 kg)   SpO2 100%   BMI 30.42 kg/m   Physical Exam Constitutional:      Appearance: Normal appearance.  HENT:     Head: Normocephalic and atraumatic.  Eyes:     General: No scleral icterus. Cardiovascular:     Rate and Rhythm: Normal rate and regular rhythm.  Pulmonary:     Effort: Pulmonary effort is normal.     Breath sounds: Normal breath sounds.  Musculoskeletal:     Cervical back: Neck supple.  Neurological:  General: No focal deficit present.     Mental Status: He is alert.  Psychiatric:        Mood and Affect: Mood normal.        Behavior: Behavior normal.     ------------------------------------------------------------------------------------------------------------------------------------------------------------------------------------------------------------------- Assessment and Plan  Essential hypertension with goal blood pressure less than 140/90 BP elevated today.  He doesn't want to restart previous medication due to dizziness.  Will start back lisinopril 20mg  and have him follow up for nurse visit in 4 weeks for BP check.  He will have labs completed  when he returns as well.    Meds ordered this encounter  Medications  . lisinopril (ZESTRIL) 20 MG tablet    Sig: Take 1 tablet (20 mg total) by mouth daily.    Dispense:  90 tablet    Refill:  3    Return in about 6 months (around 02/23/2021) for HTN.    This visit occurred during the SARS-CoV-2 public health emergency.  Safety protocols were in place, including screening questions prior to the visit, additional usage of staff PPE, and extensive cleaning of exam room while observing appropriate contact time as indicated for disinfecting solutions.

## 2020-08-24 NOTE — Patient Instructions (Signed)
Have labs completed.  Take lisinopril 20mg  daily for blood pressure Return in 4 weeks for BP check with nurse.

## 2020-09-22 ENCOUNTER — Ambulatory Visit: Payer: Medicare Other

## 2020-12-27 ENCOUNTER — Encounter: Payer: Self-pay | Admitting: Emergency Medicine

## 2020-12-27 ENCOUNTER — Other Ambulatory Visit: Payer: Self-pay

## 2020-12-27 ENCOUNTER — Emergency Department
Admission: EM | Admit: 2020-12-27 | Discharge: 2020-12-27 | Disposition: A | Discharge status: Home / Self Care | Payer: Medicare Other | Source: Home / Self Care

## 2020-12-27 DIAGNOSIS — W57XXXA Bitten or stung by nonvenomous insect and other nonvenomous arthropods, initial encounter: Secondary | ICD-10-CM

## 2020-12-27 DIAGNOSIS — S0006XA Insect bite (nonvenomous) of scalp, initial encounter: Secondary | ICD-10-CM | POA: Diagnosis not present

## 2020-12-27 MED ORDER — DOXYCYCLINE HYCLATE 100 MG PO CAPS: 100.0000 mg | ORAL_CAPSULE | Freq: Two times a day (BID) | ORAL | 0 refills | Status: DC

## 2020-12-27 NOTE — Discharge Instructions (Addendum)
Advised/instructed patient to take medication as directed with food to come completion.  Encouraged patient increase daily water intake while taking this medication.

## 2020-12-27 NOTE — ED Provider Notes (Signed)
[facility]    CSN: [id number] Arrival date & time: 12/27/20  1524      History   Chief Complaint Chief Complaint  Patient presents with   Insect Bite    HPI Ralph Kim is a 80 y.o. male.   HPI 80 year old male presents with tick bite with rash of left scalp area 2 days ago.  Reports wife pulled off tick completely intact which he has with him today at this office visit.   Past Medical History:  Diagnosis Date   Bronchitis, acute    PMH   Hypertension    Kidney cysts    Liver cyst    Obesity    Osteoarthritis     Patient Active Problem List   Diagnosis Date Noted   Folliculitis 04/14/2020   AKI (acute kidney injury) (HCC) 10/07/2018   Chronic knee pain after total replacement of left knee joint 09/30/2016   Left foot pain 09/27/2016   Right inguinal hernia 07/05/2016   Nocturia more than twice per night 06/16/2016   Essential hypertension with goal blood pressure less than 140/90 06/16/2016   Cramp in lower leg 06/15/2016   History of arthroplasty of left knee 01/10/2014   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 Authorizing Provider  doxycycline (VIBRAMYCIN) 100 MG capsule Take 1 capsule (100 mg total) by mouth 2 (two) times daily. 12/27/20  Yes Trevor Iha, FNP  lisinopril (ZESTRIL) 20 MG tablet Take 1 tablet (20 mg total) by mouth daily. 08/24/20  Yes Everrett Coombe, DO    Family History Family History  Problem Relation Age of Onset   Cancer Brother     Social History Social History   Tobacco Use   Smoking status: Never   Smokeless tobacco: Never  Substance  Use Topics   Alcohol use: No   Drug use: No     Allergies   Cortisone, Gabapentin, Hydrocodone, and Celebrex [celecoxib]   Review of Systems Review of Systems  Skin:  Positive for rash.  All other systems reviewed and are negative.   Physical Exam Triage Vital Signs ED Triage Vitals  Enc Vitals Group     BP 12/27/20 1536 (!) 190/96     Pulse Rate 12/27/20 1536 76     Resp 12/27/20 1536 16     Temp 12/27/20 1536 97.6 F (36.4 C)     Temp Source 12/27/20 1536 Oral     SpO2 12/27/20 1536 97 %     Weight --      Height --      Head Circumference --      Peak Flow --      Pain Score 12/27/20 1535 0     Pain Loc --      Pain Edu? --      Excl. in GC? --    No data found.  Updated Vital Signs BP (!) 190/96 (BP Location: Right Arm)   Pulse 76   Temp 97.6 F (36.4 C) (Oral)   Resp 16   SpO2 97%  Physical Exam Vitals and nursing note reviewed.  Constitutional:      General: He is not in acute distress.    Appearance: Normal appearance. He is normal weight. He is not ill-appearing.  HENT:     Head: Normocephalic and atraumatic.     Nose: Nose normal.     Mouth/Throat:     Mouth: Mucous membranes are moist.     Pharynx: Oropharynx is clear.  Eyes:     Extraocular Movements: Extraocular movements intact.     Conjunctiva/sclera: Conjunctivae normal.     Pupils: Pupils are equal, round, and reactive to light.  Cardiovascular:     Rate and Rhythm: Normal rate and regular rhythm.     Pulses: Normal pulses.     Heart sounds: Normal heart sounds.  Pulmonary:     Effort: Pulmonary effort is normal.     Breath sounds: Normal breath sounds. No wheezing, rhonchi or rales.  Musculoskeletal:        General: Normal range of motion.     Cervical back: Normal range of motion and neck supple. No tenderness.  Lymphadenopathy:     Cervical: No cervical adenopathy.  Skin:    General: Skin is warm and dry.     Comments: Scalp (left sided posterior occipital parietal  area): 2.0 cm x 2.0 cm circular-shaped erythematous patch noted  Neurological:     General: No focal deficit present.     Mental Status: He is alert and oriented to person, place, and time. Mental status is at baseline.  Psychiatric:        Mood and Affect: Mood normal.        Behavior: Behavior normal.        Thought Content: Thought content normal.     UC Treatments / Results  Labs (all labs ordered are listed, but only abnormal results are displayed) Labs Reviewed - No data to display  EKG   Radiology No results found.  Procedures Procedures (including critical care time)  Medications Ordered in UC Medications - No data to display  Initial Impression / Assessment and Plan / UC Course  I have reviewed the triage vital signs and the nursing notes.  Pertinent labs & imaging results that were available during my care of the patient were reviewed by me and considered in my medical decision making (see chart for details).     MDM: 1. Tick bite of scalp, initial encounter-Rx'd Doxycycline. Advised/instructed patient to take medication as directed with food to come completion.  Encouraged patient increase daily water intake while taking this medication.  Patient discharged home, hemodynamically stable. Final Clinical Impressions(s) / UC Diagnoses   Final diagnoses:  Tick bite of scalp, initial encounter     Discharge Instructions      Advised/instructed patient to take medication as directed with food to come completion.  Encouraged patient increase daily water intake while taking this medication.     ED Prescriptions     Medication Sig Dispense Auth. Provider   doxycycline (VIBRAMYCIN) 100 MG capsule Take 1 capsule (100 mg total) by mouth 2 (two) times daily. 20 capsule Trevor Iha, FNP      PDMP not reviewed this encounter.   Trevor Iha, FNP 12/27/20 1614

## 2020-12-27 NOTE — ED Triage Notes (Signed)
Patient presents to Urgent Care with complaints of tick bite from left scalp area since 2 days ago. Patient reports some itching of the area.Marland Kitchen He was advised to have the area looked at. Denies any body aches, headaches, fever or rashes.

## 2021-02-23 ENCOUNTER — Ambulatory Visit: Payer: Medicare Other | Admitting: Family Medicine

## 2022-01-05 ENCOUNTER — Encounter: Payer: Self-pay | Admitting: General Practice

## 2022-01-26 ENCOUNTER — Telehealth: Payer: Self-pay | Admitting: General Practice

## 2022-01-26 NOTE — Telephone Encounter (Signed)
Transition Care Management Follow-up Telephone Call Date of discharge and from where: 01/24/22 from [facility] How have you been since you were released from the hospital? Patient stated that he has a different PCP. Dr. Geoffery Spruce removed as PCP. Any questions or concerns? No

## 2022-02-08 ENCOUNTER — Other Ambulatory Visit: Payer: Self-pay | Admitting: Emergency Medicine

## 2022-02-08 DIAGNOSIS — M79662 Pain in left lower leg: Secondary | ICD-10-CM

## 2022-02-25 LAB — COLOGUARD: COLOGUARD: POSITIVE — AB

## 2022-03-30 ENCOUNTER — Other Ambulatory Visit: Payer: Self-pay

## 2022-03-30 ENCOUNTER — Ambulatory Visit: Payer: Medicare Other

## 2022-03-30 DIAGNOSIS — R413 Other amnesia: Secondary | ICD-10-CM

## 2022-03-31 ENCOUNTER — Ambulatory Visit (INDEPENDENT_AMBULATORY_CARE_PROVIDER_SITE_OTHER): Payer: Medicare Other

## 2022-03-31 DIAGNOSIS — R413 Other amnesia: Secondary | ICD-10-CM

## 2022-05-08 ENCOUNTER — Ambulatory Visit
Admission: RE | Admit: 2022-05-08 | Discharge: 2022-05-08 | Disposition: A | Discharge status: Home / Self Care | Payer: Medicare Other | Source: Ambulatory Visit | Attending: Family Medicine | Admitting: Family Medicine

## 2022-05-08 VITALS — BP 154/89 | HR 84 | Temp 99.5°F | Resp 18 | Ht 69.0 in | Wt 207.0 lb

## 2022-05-08 DIAGNOSIS — J029 Acute pharyngitis, unspecified: Secondary | ICD-10-CM | POA: Diagnosis not present

## 2022-05-08 DIAGNOSIS — Z2821 Immunization not carried out because of patient refusal: Secondary | ICD-10-CM | POA: Diagnosis not present

## 2022-05-08 DIAGNOSIS — U071 COVID-19: Secondary | ICD-10-CM

## 2022-05-08 LAB — POCT INFLUENZA A/B
Influenza A, POC: NEGATIVE
Influenza B, POC: NEGATIVE

## 2022-05-08 LAB — POC SARS CORONAVIRUS 2 AG -  ED: SARS Coronavirus 2 Ag: POSITIVE — AB

## 2022-05-08 MED ORDER — TRAMADOL-ACETAMINOPHEN 37.5-325 MG PO TABS: 1.0000 | ORAL_TABLET | Freq: Four times a day (QID) | ORAL | 0 refills | Status: DC | PRN

## 2022-05-08 MED ORDER — PAXLOVID (300/100) 20 X 150 MG & 10 X 100MG PO TBPK: ORAL_TABLET | ORAL | 0 refills | Status: DC

## 2022-05-08 NOTE — ED Triage Notes (Signed)
Sore throat w/ cough x 4 days Chills Has not checked temp at home  OTC meds - advil - none today

## 2022-05-08 NOTE — Discharge Instructions (Signed)
You have COVID-19.  You must quarantine at home for 5 days.  You must wear a mask for 10 days. Drink lots of water Take Paxlovid 2 times a day for 5 days Take Tylenol for pain.  If this is not strong enough to take Ultracet 1 or 2 pills.  Take the Ultracet at bedtime so you can sleep. Call for problems

## 2022-05-08 NOTE — ED Provider Notes (Signed)
[facility]    CSN: [id number] Arrival date & time: 05/08/22  1335      History   Chief Complaint Chief Complaint  Patient presents with   Sore Throat    HPI Ralph Kim is a 81 y.o. male.   HPI  Patient has had sweats and chills.  Body aches and fatigue.  Cough for 4 days.  No known exposure to illness.  He is not vaccinated for flu or for COVID.  Does not believe in vaccinations.  He denies underlying lung disease or asthma.  Non-smoker.  No chest pain or shortness of breath.  Past Medical History:  Diagnosis Date   Bronchitis, acute    PMH   Hypertension    Kidney cysts    Liver cyst    Obesity    Osteoarthritis     Patient Active Problem List   Diagnosis Date Noted   Immunization refused 05/08/2022   Folliculitis 04/14/2020   AKI (acute kidney injury) (HCC) 10/07/2018   Chronic knee pain after total replacement of left knee joint 09/30/2016   Left foot pain 09/27/2016   Right inguinal hernia 07/05/2016   Nocturia more than twice per night 06/16/2016   Essential hypertension with goal blood pressure less than 140/90 06/16/2016   Cramp in lower leg 06/15/2016   History of arthroplasty of left knee 01/10/2014   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 Authorizing Provider  nirmatrelvir & ritonavir (PAXLOVID, 300/100,) 20 x 150 MG & 10 x 100MG  TBPK Take medication as directed BID for 5 days 05/08/22  Yes 05/10/22, MD  sildenafil (VIAGRA) 100 MG tablet Take by mouth. 03/10/22  Yes [provider]  tamsulosin (FLOMAX) 0.4 MG CAPS capsule Take by mouth.  01/21/22  Yes [provider]  traMADol-acetaminophen (ULTRACET) 37.5-325 MG tablet Take 1-2 tablets by mouth every 6 (six) hours as needed. 05/08/22  Yes 05/10/22, MD  lisinopril-hydrochlorothiazide (ZESTORETIC) 20-25 MG tablet Oral for 90 Days    [provider]    Family History Family History  Problem Relation Age of Onset   Cancer Brother     Social History Social History   Tobacco Use   Smoking status: Never   Smokeless tobacco: Never  Vaping Use   Vaping Use: Never used  Substance Use Topics   Alcohol use: No   Drug use: No     Allergies   Cortisone, Gabapentin, Hydrocodone, and Celebrex [celecoxib]   Review of Systems Review of Systems   Physical Exam Triage Vital Signs ED Triage Vitals  Enc Vitals Group     BP 05/08/22 1428 (!) 154/89     Pulse Rate 05/08/22 1428 84     Resp 05/08/22 1428 18     Temp 05/08/22 1428 99.5 F (37.5 C)     Temp Source 05/08/22 1428 Oral     SpO2 05/08/22 1428 97 %     Weight 05/08/22 1430 207 lb (93.9 kg)     Height 05/08/22 1430 5\' 9"  (1.753 m)  Head Circumference --      Peak Flow --      Pain Score 05/08/22 1430 3     Pain Loc --      Pain Edu? --      Excl. in GC? --    No data found.  Updated Vital Signs BP (!) 154/89 (BP Location: Left Arm)   Pulse 84   Temp 99.5 F (37.5 C) (Oral)   Resp 18   Ht 5\' 9"  (1.753 m)   Wt 93.9 kg   SpO2 97%   BMI 30.57 kg/m   Visual Acuity Right Eye Distance:   Left Eye Distance:   Bilateral Distance:    Right Eye Near:   Left Eye Near:    Bilateral Near:     Physical Exam Constitutional:      General: He is not in acute distress.    Appearance: He is well-developed. He is ill-appearing.  HENT:     Head: Normocephalic and atraumatic.     Right Ear: Tympanic membrane normal.     Left Ear: Tympanic membrane normal.     Nose: No congestion.     Mouth/Throat:     Mouth: Mucous membranes are moist.     Pharynx: Posterior oropharyngeal  erythema present.  Eyes:     Conjunctiva/sclera: Conjunctivae normal.     Pupils: Pupils are equal, round, and reactive to light.  Cardiovascular:     Rate and Rhythm: Normal rate.     Heart sounds: Normal heart sounds.  Pulmonary:     Effort: Pulmonary effort is normal. No respiratory distress.     Breath sounds: Normal breath sounds.  Abdominal:     General: There is no distension.     Palpations: Abdomen is soft.  Musculoskeletal:        General: Normal range of motion.     Cervical back: Normal range of motion.  Lymphadenopathy:     Cervical: No cervical adenopathy.  Skin:    General: Skin is warm and dry.  Neurological:     Mental Status: He is alert.      UC Treatments / Results  Labs (all labs ordered are listed, but only abnormal results are displayed) Labs Reviewed  POC SARS CORONAVIRUS 2 AG -  ED - Abnormal; Notable for the following components:      Result Value   SARS Coronavirus 2 Ag Positive (*)    All other components within normal limits  POCT INFLUENZA A/B    EKG   Radiology No results found.  Procedures Procedures (including critical care time)  Medications Ordered in UC Medications - No data to display  Initial Impression / Assessment and Plan / UC Course  I have reviewed the triage vital signs and the nursing notes.  Pertinent labs & imaging results that were available during my care of the patient were reviewed by me and considered in my medical decision making (see chart for details).     Discussed COVID.  Discussed treatment.  Discussed quarantine and mask wearing.  Call for problems Final Clinical Impressions(s) / UC Diagnoses   Final diagnoses:  COVID-19  Sore throat  Immunization refused     Discharge Instructions      You have COVID-19.  You must quarantine at home for 5 days.  You must wear a mask for 10 days. Drink lots of water Take Paxlovid 2 times a day for 5 days Take Tylenol for pain.  If this is not strong  enough to take Ultracet 1 or 2 pills.  Take the Ultracet at bedtime so you can sleep. Call for problems     ED Prescriptions     Medication Sig Dispense Auth. Provider   nirmatrelvir & ritonavir (PAXLOVID, 300/100,) 20 x 150 MG & 10 x 100MG  TBPK Take medication as directed BID for 5 days 1 each , MD   traMADol-acetaminophen (ULTRACET) 37.5-325 MG tablet Take 1-2 tablets by mouth every 6 (six) hours as needed. 20 tablet 08-28-1999, MD      I have reviewed the PDMP during this encounter.   Eustace Moore, MD 05/08/22 [phone]

## 2022-06-17 ENCOUNTER — Ambulatory Visit
Admission: RE | Admit: 2022-06-17 | Discharge: 2022-06-17 | Disposition: A | Discharge status: Home / Self Care | Payer: Medicare Other | Source: Ambulatory Visit | Attending: Family Medicine | Admitting: Family Medicine

## 2022-06-17 ENCOUNTER — Ambulatory Visit (INDEPENDENT_AMBULATORY_CARE_PROVIDER_SITE_OTHER): Payer: Medicare Other

## 2022-06-17 VITALS — BP 141/84 | HR 84 | Temp 98.4°F | Resp 16

## 2022-06-17 DIAGNOSIS — R059 Cough, unspecified: Secondary | ICD-10-CM

## 2022-06-17 DIAGNOSIS — J209 Acute bronchitis, unspecified: Secondary | ICD-10-CM

## 2022-06-17 MED ORDER — AZITHROMYCIN 250 MG PO TABS: ORAL_TABLET | ORAL | 0 refills | Status: DC

## 2022-06-17 NOTE — ED Provider Notes (Signed)
[facility]    CSN: [id number] Arrival date & time: 06/17/22  1405      History   Chief Complaint Chief Complaint  Patient presents with   Cough    Chest cough - Entered by patient    HPI Ralph Kim is a 82 y.o. male.   Patient reports that he has had an occasional for for several months.  During the past five days his cough has been constant and he has had a sore throat.  He denies pleuritic pain, shortness of breath, and fevers, chills, and sweats.  The history is provided by the patient.    Past Medical History:  Diagnosis Date   Bronchitis, acute    PMH   Hypertension    Kidney cysts    Liver cyst    Obesity    Osteoarthritis     Patient Active Problem List   Diagnosis Date Noted   Immunization refused 03/54/6568   Folliculitis 12/75/1700   AKI (acute kidney injury) ([place]) 10/07/2018   Chronic knee pain after total replacement of left knee joint 09/30/2016   Left foot pain 09/27/2016   Right inguinal hernia 07/05/2016   Nocturia more than twice per night 06/16/2016   Essential hypertension with goal blood pressure less than 140/90 06/16/2016   Cramp in lower leg 06/15/2016   History of arthroplasty of left knee 01/10/2014   Cystic disease of liver 09/05/2012    Past Surgical History:  Procedure Laterality Date   CYST EXCISION     on back   TOTAL KNEE ARTHROPLASTY Left 03/30/2015   Procedure: TOTAL KNEE ARTHROPLASTY;  Surgeon: Dorna Leitz, MD;  Location: [facility];  Service: Orthopedics;  Laterality: Left;   WISDOM TOOTH EXTRACTION         Home Medications    Prior to Admission medications   Medication Sig Start Date End Date Taking? Authorizing Provider  azithromycin (ZITHROMAX Z-PAK) 250 MG tablet Take 2 tabs today; then begin one tab once daily for 4 more days. 06/17/22  Yes Kandra Nicolas, MD  lisinopril-hydrochlorothiazide (ZESTORETIC) 20-25 MG tablet Oral for 90 Days    [provider]  nirmatrelvir & ritonavir (PAXLOVID,  300/100,) 20 x 150 MG & 10 x 100MG  TBPK Take medication as directed BID for 5 days 05/08/22   Raylene Everts, MD  sildenafil (VIAGRA) 100 MG tablet Take by mouth. 03/10/22   [provider]  tamsulosin (FLOMAX) 0.4 MG CAPS capsule Take by mouth. 01/21/22   [provider]  traMADol-acetaminophen (ULTRACET) 37.5-325 MG tablet Take 1-2 tablets by mouth every 6 (six) hours as needed. 05/08/22   Raylene Everts, MD    Family History Family History  Problem Relation Age of Onset   Cancer Brother     Social History Social History   Tobacco Use   Smoking status: Never   Smokeless tobacco: Never  Vaping Use   Vaping Use: Never used  Substance Use Topics   Alcohol use: No   Drug use: No     Allergies   Cortisone, Gabapentin, Hydrocodone, and Celebrex [celecoxib]   Review of Systems Review of Systems + sore throat + cough No pleuritic pain No wheezing No nasal congestion + post-nasal drainage No sinus pain/pressure No itchy/red eyes No earache No hemoptysis No SOB No fever/chills No nausea No vomiting No abdominal pain No diarrhea No urinary symptoms No skin rash + fatigue No myalgias No headache   Physical Exam Triage Vital Signs ED Triage Vitals  Enc Vitals Group     BP 06/17/22 1457 (!) 141/84     Pulse Rate 06/17/22 1457 84     Resp 06/17/22 1457 16     Temp 06/17/22 1457 98.4 F (36.9 C)     Temp Source 06/17/22 1457 Oral     SpO2 06/17/22 1457 98 %     Weight --      Height --      Head Circumference --      Peak Flow --      Pain Score 06/17/22 1458 0     Pain Loc --      Pain Edu? --      Excl. in [place]? --    No data found.  Updated Vital Signs BP (!) 141/84 (BP Location: Right Arm)   Pulse 84   Temp 98.4 F (36.9 C) (Oral)   Resp 16   SpO2 98%   Visual Acuity Right Eye Distance:   Left Eye Distance:   Bilateral Distance:    Right Eye Near:   Left Eye Near:    Bilateral Near:     Physical Exam Nursing  notes and Vital Signs reviewed. Appearance:  Patient appears stated age, and in no acute distress Eyes:  Pupils are equal, round, and reactive to light and accomodation.  Extraocular movement is intact.  Conjunctivae are not inflamed  Ears:  Canals normal.  Tympanic membranes normal.  Nose:  Mildly congested turbinates.  No sinus tenderness.  Pharynx:  Normal Neck:  Supple.  Mildly enlarged lateral nodes are present, tender to palpation on the left.   Lungs:  Scattered rhonchi bilaterally. Breath sounds are equal.  Moving air well. Heart:  Regular rate and rhythm without murmurs, rubs, or gallops.  Abdomen:  Nontender without masses or hepatosplenomegaly.  Bowel sounds are present.  No CVA or flank tenderness.  Extremities:  No edema.  Skin:  No rash present.   UC Treatments / Results  Labs (all labs ordered are listed, but only abnormal results are displayed) Labs Reviewed - No data to display  EKG   Radiology DG Chest 2 View  Result Date: 06/17/2022 CLINICAL DATA:  Cough. EXAM: CHEST - 2 VIEW COMPARISON:  March 25, 2015. FINDINGS: The heart size and mediastinal contours are within normal limits. Both lungs are clear. The visualized skeletal structures are unremarkable. IMPRESSION: No active cardiopulmonary disease. Electronically Signed   By: Marijo Conception M.D.   On: 06/17/2022 16:15    Procedures Procedures (including critical care time)  Medications Ordered in UC Medications - No data to display  Initial Impression / Assessment and Plan / UC Course  I have reviewed the triage vital signs and the nursing notes.  Pertinent labs & imaging results that were available during my care of the patient were reviewed by me and considered in my medical decision making (see chart for details).    Negative chest x-ray reassuring. Begin Z-pack. Followup with Family Doctor if not improved in one week.   Final Clinical Impressions(s) / UC Diagnoses   Final diagnoses:  Acute  bronchitis, unspecified organism     Discharge Instructions      Take plain guaifenesin (1200mg  extended release tabs such as Mucinex) twice daily, with plenty of water, for cough and congestion.  Get adequate rest.   For sinus congestion, recommend using saline nasal spray several times daily and saline nasal irrigation (AYR is a common brand).   Try warm salt water gargles for sore  throat.  May take Delsym Cough Suppressant ("12 Hour Cough Relief") at bedtime for nighttime cough.  Stop all antihistamines (Nyquil, etc) for now, and other non-prescription cough/cold preparations.       ED Prescriptions     Medication Sig Dispense Auth. Provider   azithromycin (ZITHROMAX Z-PAK) 250 MG tablet Take 2 tabs today; then begin one tab once daily for 4 more days. 6 tablet Kandra Nicolas, MD         Kandra Nicolas, MD 06/19/22 [phone]

## 2022-06-17 NOTE — ED Triage Notes (Signed)
Cough x 3 days, denies hx of lung problems and smoking

## 2022-06-17 NOTE — Discharge Instructions (Signed)
Take plain guaifenesin (1200mg  extended release tabs such as Mucinex) twice daily, with plenty of water, for cough and congestion.  Get adequate rest.   For sinus congestion, recommend using saline nasal spray several times daily and saline nasal irrigation (AYR is a common brand).   Try warm salt water gargles for sore throat.  May take Delsym Cough Suppressant ("12 Hour Cough Relief") at bedtime for nighttime cough.  Stop all antihistamines (Nyquil, etc) for now, and other non-prescription cough/cold preparations.

## 2022-06-18 ENCOUNTER — Telehealth: Payer: Self-pay | Admitting: Family Medicine

## 2022-06-18 ENCOUNTER — Telehealth: Payer: Self-pay | Admitting: Emergency Medicine

## 2022-06-18 MED ORDER — BENZONATATE 200 MG PO CAPS: 200.0000 mg | ORAL_CAPSULE | Freq: Three times a day (TID) | ORAL | 0 refills | Status: DC | PRN

## 2022-06-18 NOTE — Telephone Encounter (Signed)
Call from [place] regarding additional medication for cough. Chart reviewed by Dr Meda Coffee wh will send in tessalon perles. Pt directed to take tessalon perles w/ delsym 2 times daily and to increase PO fluids. Wife verbalized an understanding that medication would only work if [place] drank enough water. [place] also verbalized an understanding that she should discard prescription for tessalon perles from 4 years ago.

## 2022-06-18 NOTE — Telephone Encounter (Signed)
Patient was seen yesterday for cough.  He was given a Z-Pak.  He was told to take over-the-counter cough medicine with DM (like Delsym.  Wife calls back today and states that it is not working at all.  Will add Tessalon.  Call for problems

## 2022-07-20 ENCOUNTER — Ambulatory Visit
Admission: RE | Admit: 2022-07-20 | Discharge: 2022-07-20 | Disposition: A | Discharge status: Home / Self Care | Payer: Medicare Other | Source: Ambulatory Visit | Attending: Family Medicine | Admitting: Family Medicine

## 2022-07-20 VITALS — BP 113/70 | HR 100 | Temp 98.2°F | Resp 18 | Ht 68.0 in | Wt 207.0 lb

## 2022-07-20 DIAGNOSIS — R051 Acute cough: Secondary | ICD-10-CM | POA: Diagnosis not present

## 2022-07-20 DIAGNOSIS — J209 Acute bronchitis, unspecified: Secondary | ICD-10-CM

## 2022-07-20 MED ORDER — BENZONATATE 200 MG PO CAPS: 200.0000 mg | ORAL_CAPSULE | Freq: Three times a day (TID) | ORAL | 0 refills | Status: AC | PRN

## 2022-07-20 MED ORDER — AMOXICILLIN-POT CLAVULANATE 875-125 MG PO TABS: 1.0000 | ORAL_TABLET | Freq: Two times a day (BID) | ORAL | 0 refills | Status: AC

## 2022-07-20 NOTE — ED Triage Notes (Signed)
Junky cough x 2 weeks - nonproductive OTC  cough medicine  along  asa ('325mg'$ )  Pt took 2 ASA at 0800 Denies fevers Denies DOE or being SOB  Pt's wife is also sick w/ same

## 2022-07-20 NOTE — ED Provider Notes (Signed)
[facility]    CSN: [id number] Arrival date & time: 07/20/22  1413      History   Chief Complaint Chief Complaint  Patient presents with   Cough    Entered by patient    HPI Ralph Kim is a 82 y.o. male.   HPI  Patient is here for a cough.  It has been going on for about 2 weeks.  He states that he has a "junky" cough with some sputum production.  His wife is sick as well.  She went to her pulmonary doctor today and was treated with Augmentin.  She sends with him a note requesting he received the same.  No fever or chills.  No nausea or vomiting.  No cold symptoms.  Past Medical History:  Diagnosis Date   Bronchitis, acute    PMH   Hypertension    Kidney cysts    Liver cyst    Obesity    Osteoarthritis     Patient Active Problem List   Diagnosis Date Noted   Immunization refused [id number]   Folliculitis [id number]   AKI (acute kidney injury) ([place]) 10/07/2018   Chronic knee pain after total replacement of left knee joint 09/30/2016   Left foot pain 09/27/2016   Right inguinal hernia 07/05/2016   Nocturia more than twice per night 06/16/2016   Essential hypertension with goal blood pressure less than 140/90 06/16/2016   Cramp in lower leg 06/15/2016   History of arthroplasty of left knee 01/10/2014   Cystic disease of liver 09/05/2012    Past Surgical History:  Procedure Laterality Date   CYST EXCISION     on back   TOTAL KNEE ARTHROPLASTY Left 03/30/2015   Procedure: TOTAL KNEE ARTHROPLASTY;  Surgeon: Dorna Leitz, MD;  Location: [facility];  Service: Orthopedics;  Laterality: Left;   WISDOM TOOTH EXTRACTION         Home Medications    Prior to Admission medications   Medication Sig Start Date End Date Taking? Authorizing Provider  amoxicillin-clavulanate (AUGMENTIN) 875-125 MG tablet Take 1 tablet by mouth every 12 (twelve) hours. 07/20/22  Yes Raylene Everts, MD  benzonatate (TESSALON) 200 MG capsule Take 1 capsule (200 mg total) by mouth 3  (three) times daily as needed for cough. 07/20/22   Raylene Everts, MD  lisinopril-hydrochlorothiazide (ZESTORETIC) 20-25 MG tablet Oral for 90 Days    [provider]  sildenafil (VIAGRA) 100 MG tablet Take by mouth. 03/10/22   [provider]  tamsulosin (FLOMAX) 0.4 MG CAPS capsule Take by mouth. 01/21/22   [provider]    Family History Family History  Problem Relation Age of Onset   Cancer Brother     Social History Social History   Tobacco Use   Smoking status: Never   Smokeless tobacco: Never  Vaping Use   Vaping Use: Never used  Substance Use Topics   Alcohol use: No   Drug use: No     Allergies   Cortisone, Gabapentin, Hydrocodone, and Celebrex [celecoxib]   Review of Systems Review of Systems See HPI  Physical Exam Triage Vital Signs ED Triage Vitals  Enc Vitals Group     BP 07/20/22 1442 113/70     Pulse Rate 07/20/22 1442 100     Resp 07/20/22 1442 18     Temp 07/20/22 1442 98.2 F (36.8 C)     Temp Source 07/20/22 1442 Oral     SpO2 07/20/22 1442 97 %  Weight 07/20/22 1444 207 lb (93.9 kg)     Height 07/20/22 1444 '5\' 8"'$  (1.727 m)     Head Circumference --      Peak Flow --      Pain Score 07/20/22 1443 2     Pain Loc --      Pain Edu? --      Excl. in [place]? --    No data found.  Updated Vital Signs BP 113/70 (BP Location: Left Arm)   Pulse 100   Temp 98.2 F (36.8 C) (Oral)   Resp 18   Ht '5\' 8"'$  (1.727 m)   Wt 93.9 kg   SpO2 97%   BMI 31.47 kg/m       Physical Exam Constitutional:      General: He is not in acute distress.    Appearance: He is well-developed. He is obese. He is ill-appearing.  HENT:     Head: Normocephalic and atraumatic.     Mouth/Throat:     Mouth: Mucous membranes are moist.     Pharynx: No posterior oropharyngeal erythema.  Eyes:     Conjunctiva/sclera: Conjunctivae normal.     Pupils: Pupils are equal, round, and reactive to light.  Cardiovascular:     Rate and Rhythm:  Normal rate.     Heart sounds: Murmur heard.     Comments: Occasional ectopy.  Harsh systolic murmur Pulmonary:     Effort: Pulmonary effort is normal. No respiratory distress.     Breath sounds: Rhonchi present.  Abdominal:     General: There is no distension.     Palpations: Abdomen is soft.  Musculoskeletal:        General: Normal range of motion.     Cervical back: Normal range of motion.     Right lower leg: No edema.     Left lower leg: No edema.  Skin:    General: Skin is warm and dry.  Neurological:     Mental Status: He is alert.      UC Treatments / Results  Labs (all labs ordered are listed, but only abnormal results are displayed) Labs Reviewed - No data to display  EKG   Radiology No results found.  Procedures Procedures (including critical care time)  Medications Ordered in UC Medications - No data to display  Initial Impression / Assessment and Plan / UC Course  I have reviewed the triage vital signs and the nursing notes.  Pertinent labs & imaging results that were available during my care of the patient were reviewed by me and considered in my medical decision making.  mild cognitive impairment.  Appreciate note from wife. Final Clinical Impressions(s) / UC Diagnoses   Final diagnoses:  Acute bronchitis, unspecified organism  Acute cough     Discharge Instructions      Make sure you are drinking lots of water Take the Augmentin antibiotic 2 times a day Take a probiotic while you are on a strong antibiotic I have prescribed Tessalon pearls for the cough.  You may take 3 times a day May take in addition a DM cough medicine like Delsym, Mucinex DM, or Robitussin DM    ED Prescriptions     Medication Sig Dispense Auth. Provider   benzonatate (TESSALON) 200 MG capsule Take 1 capsule (200 mg total) by mouth 3 (three) times daily as needed for cough. 21 capsule Raylene Everts, MD   amoxicillin-clavulanate (AUGMENTIN) 875-125 MG tablet  Take 1 tablet by mouth every 12 (  twelve) hours. 14 tablet Raylene Everts, MD      PDMP not reviewed this encounter.   Raylene Everts, MD 07/20/22 [phone]

## 2022-07-20 NOTE — Discharge Instructions (Signed)
Make sure you are drinking lots of water Take the Augmentin antibiotic 2 times a day Take a probiotic while you are on a strong antibiotic I have prescribed Tessalon pearls for the cough.  You may take 3 times a day May take in addition a DM cough medicine like Delsym, Mucinex DM, or Robitussin DM

## 2024-06-05 ENCOUNTER — Other Ambulatory Visit: Payer: Self-pay

## 2024-06-05 ENCOUNTER — Ambulatory Visit: Attending: Sports Medicine | Admitting: Physical Therapy

## 2024-06-05 ENCOUNTER — Encounter: Payer: Self-pay | Admitting: Physical Therapy

## 2024-06-05 DIAGNOSIS — M6281 Muscle weakness (generalized): Secondary | ICD-10-CM | POA: Insufficient documentation

## 2024-06-05 DIAGNOSIS — R262 Difficulty in walking, not elsewhere classified: Secondary | ICD-10-CM | POA: Insufficient documentation

## 2024-06-05 DIAGNOSIS — M79605 Pain in left leg: Secondary | ICD-10-CM | POA: Diagnosis present

## 2024-06-05 NOTE — Therapy (Signed)
 " OUTPATIENT PHYSICAL THERAPY LOWER EXTREMITY EVALUATION   Patient Name: Ralph Kim MRN: [id number] DOB:1940/10/26, 84 y.o., male Today's Date: 06/05/2024  END OF SESSION:  PT End of Session - 06/05/24 1144     Visit Number 1    Number of Visits 16    Date for Recertification  07/31/24    Authorization Type [employer]    Authorization - Visit Number 1    Progress Note Due on Visit 10    PT Start Time 1106    PT Stop Time 1138    PT Time Calculation (min) 32 min    Activity Tolerance Patient tolerated treatment well    Behavior During Therapy WFL for tasks assessed/performed          Past Medical History:  Diagnosis Date   Bronchitis, acute    PMH   Hypertension    Kidney cysts    Liver cyst    Obesity    Osteoarthritis    Past Surgical History:  Procedure Laterality Date   CYST EXCISION     on back   TOTAL KNEE ARTHROPLASTY Left 03/30/2015   Procedure: TOTAL KNEE ARTHROPLASTY;  Surgeon: Norleen Gavel, MD;  Location: [facility];  Service: Orthopedics;  Laterality: Left;   WISDOM TOOTH EXTRACTION     Patient Active Problem List   Diagnosis Date Noted   Immunization refused 05/08/2022   Folliculitis 04/14/2020   AKI (acute kidney injury) 10/07/2018   Chronic knee pain after total replacement of left knee joint 09/30/2016   Left foot pain 09/27/2016   Right inguinal hernia 07/05/2016   Nocturia more than twice per night 06/16/2016   Essential hypertension with goal blood pressure less than 140/90 06/16/2016   Cramp in lower leg 06/15/2016   History of arthroplasty of left knee 01/10/2014   Cystic disease of liver 09/05/2012    PCP: None on file  REFERRING PROVIDER: Thekkekandam  REFERRING DIAG: pain in bilateral legs  THERAPY DIAG:  Muscle weakness (generalized)  Difficulty in walking, not elsewhere classified  Rationale for Evaluation and Treatment: Rehabilitation  ONSET DATE: 05/2024 (MD appointment)  SUBJECTIVE:   SUBJECTIVE STATEMENT: Pt  reports Rt hip and Lt knee pain. He states the pain has been going on since he had a knee replacement in 2016. Pt states it hurts all the time but it hurts worse when I walk and move around.  He states he can't walk as far as he used to and he states he can only stand about 5 minutes due to pain. He does state that meds help a little with the pain.  PERTINENT HISTORY: TKR Lt 2016 PAIN:  Are you having pain? Yes: NPRS scale: 4/10 currently, up to 7/10 with walking Pain location: Rt hip, Lt knee Pain description: sore Aggravating factors: walking Relieving factors: meds, pain patch  PRECAUTIONS: Fall  RED FLAGS: None   WEIGHT BEARING RESTRICTIONS: No  FALLS:  Has patient fallen in last 6 months? No  LIVING ENVIRONMENT: Lives with: lives with their family Lives in: House/apartment Stairs: pt reports he has stairs with handrails. He also has a chair lift Has following equipment at home: Single point cane  OCCUPATION: retired. Enjoys publishing copy and woodworking  PLOF: Independent  PATIENT GOALS: be able to stand and walking longer  NEXT MD VISIT: PRN  OBJECTIVE:  Note: Objective measures were completed at Evaluation unless otherwise noted.  DIAGNOSTIC FINDINGS: none on file  PATIENT SURVEYS:  PSFS: THE PATIENT SPECIFIC FUNCTIONAL SCALE  Place  score of 0-10 (0 = unable to perform activity and 10 = able to perform activity at the same level as before injury or problem)  Activity Date: 06/05/24    Standing > 5 minutes 5    2.walking 5    3.     4.      Average Score 5      Total Score = Sum of activity scores/number of activities  Minimally Detectable Change: 3 points (for single activity); 2 points (for average score)  [facility] (nd). The Patient Specific Functional Scale . Retrieved from [email or web address].com.pt   COGNITION: Overall cognitive status: wife reports pt has been diagnosed with  dementia - pt able to follow commands and answer evaluation questions independently     MUSCLE LENGTH: Hamstrings: Right 35 deg; Left 40 deg   POSTURE: genu varum  PALPATION: No TTP Lt knee, Lt hip  LOWER EXTREMITY ROM:  Active ROM Right eval Left eval  Hip flexion    Hip extension    Hip abduction    Hip adduction    Hip internal rotation Limited 25% neutral  Hip external rotation Limited 25% Limited 50%  Knee flexion    Knee extension    Ankle dorsiflexion    Ankle plantarflexion    Ankle inversion    Ankle eversion     (Blank rows = not tested)  LOWER EXTREMITY MMT:  MMT Right eval Left eval  Hip flexion 4 4+  Hip extension 4- 4-  Hip abduction 4- 4  Hip adduction    Hip internal rotation    Hip external rotation    Knee flexion 4 4  Knee extension 4 4+  Ankle dorsiflexion    Ankle plantarflexion    Ankle inversion    Ankle eversion     (Blank rows = not tested)    FUNCTIONAL TESTS:  5 times sit to stand: 11.42 seconds Timed up and go (TUG): 12.21 seconds SLS Rt 2.2 seconds, Lt <1 second  GAIT: Distance walked: 100' Assistive device utilized: None Level of assistance: Modified independence Comments: wide BOS, decreased stance on Lt, decreased Lt knee flexion during gait                                                                                                                                TREATMENT DATE: 06/05/24 See HEP Pt educated on PT POC and goals, HEP, rationale for treatment    PATIENT EDUCATION:  Education details: PT POC and goals, HEP Person educated: Patient Education method: Explanation, Demonstration, and Handouts Education comprehension: verbalized understanding and returned demonstration  HOME EXERCISE PROGRAM: Access Code: [email or web address] URL: [email or web address] Date: 06/05/2024 Prepared by: Darice Conine  Program Notes Ride bicycle 10-15 minutes per day - can do in 5 minute  increments  Exercises - Bent Knee Fallouts with Alternating Legs  - 1 x daily - 7 x weekly - 3 sets - 10 reps - Supine  Bilateral Hip Internal Rotation Stretch  - 1 x daily - 7 x weekly - 3 sets - 10 reps - Seated Hamstring Stretch  - 1 x daily - 7 x weekly - 1 sets - 10 reps - 10 seconds hold  ASSESSMENT:  CLINICAL IMPRESSION: Patient is a 84 y.o. male who was seen today for physical therapy evaluation and treatment for bilateral LE pain. He presents with Rt hip and Lt knee pain which he states began after a 2016 Lt knee replacement. He presents with impaired strength, gait and balance as well as decreased functional activity tolerance. Pt will benefit from skilled PT to address deficits and improve functional mobility with decreased pain.   OBJECTIVE IMPAIRMENTS: Abnormal gait, decreased activity tolerance, decreased balance, decreased mobility, difficulty walking, decreased ROM, decreased strength, and pain.   ACTIVITY LIMITATIONS: standing and locomotion level  PARTICIPATION LIMITATIONS: community activity  PERSONAL FACTORS: Time since onset of injury/illness/exacerbation and 1-2 comorbidities: history of TKR, dementia are also affecting patient's functional outcome.   REHAB POTENTIAL: Good  CLINICAL DECISION MAKING: Evolving/moderate complexity  EVALUATION COMPLEXITY: Moderate   GOALS: Goals reviewed with patient? Yes  SHORT TERM GOALS: Target date: 07/03/2024   Pt will be independent with initial HEP Baseline: Goal status: INITIAL  2.  Pt will improve 5 x STS to <= 10 seconds to demo improved LE strength Baseline:  Goal status: INITIAL    LONG TERM GOALS: Target date: 07/31/2024    Pt will be independent with advanced HEP Baseline:  Goal status: INITIAL  2.  Pt will improve TUG to <= 10.5 seconds to demo improved functional gait Baseline:  Goal status: INITIAL  3.  Pt will improve PSFS to >= 7.5 to demo improved functional mobility Baseline:  Goal status:  INITIAL  4.  Pt will report being able to stand x 15 minutes without increase in pain Baseline:  Goal status: INITIAL 5.  Pt will perform SLS >= 5 seconds bilat to demo improved balance Baseline:  Goal status: INITIAL      PLAN:  PT FREQUENCY: 2x/week  PT DURATION: 8 weeks  PLANNED INTERVENTIONS: 97164- PT Re-evaluation, 97110-Therapeutic exercises, 97530- Therapeutic activity, 97112- Neuromuscular re-education, 97535- Self Care, 02859- Manual therapy, [phone]- Gait training, [phone]- Aquatic Therapy, [phone]- Electrical stimulation (unattended), 97016- Vasopneumatic device, [id number]- Ultrasound, [id number]- Ionotophoresis 4mg /ml Dexamethasone, 79439 (1-2 muscles), 20561 (3+ muscles)- Dry Needling, Patient/Family education, Balance training, Stair training, Taping, Cryotherapy, and Moist heat  PLAN FOR NEXT SESSION: assess response to HEP, hip mobility, LE functional strength and endurance, pain management   Salbador Fiveash, PT 06/05/2024, 11:45 AM  "

## 2024-06-12 ENCOUNTER — Ambulatory Visit

## 2024-06-14 ENCOUNTER — Ambulatory Visit: Admitting: Physical Therapy

## 2024-06-18 ENCOUNTER — Ambulatory Visit: Admitting: Physical Therapy
